# Patient Record
Sex: Male | Born: 1965
Health system: Southern US, Community
[De-identification: ages and names within clinical notes are randomized; demographics above are authoritative.]

## PROBLEM LIST (undated history)

## (undated) DIAGNOSIS — I2510 Atherosclerotic heart disease of native coronary artery without angina pectoris: Secondary | ICD-10-CM

## (undated) DIAGNOSIS — Q208 Other congenital malformations of cardiac chambers and connections: Secondary | ICD-10-CM

## (undated) DIAGNOSIS — I10 Essential (primary) hypertension: Secondary | ICD-10-CM

## (undated) DIAGNOSIS — Z720 Tobacco use: Secondary | ICD-10-CM

## (undated) DIAGNOSIS — Q249 Congenital malformation of heart, unspecified: Secondary | ICD-10-CM

## (undated) DIAGNOSIS — E785 Hyperlipidemia, unspecified: Secondary | ICD-10-CM

## (undated) DIAGNOSIS — T8859XA Other complications of anesthesia, initial encounter: Secondary | ICD-10-CM

## (undated) DIAGNOSIS — I4901 Ventricular fibrillation: Secondary | ICD-10-CM

## (undated) DIAGNOSIS — T4145XA Adverse effect of unspecified anesthetic, initial encounter: Secondary | ICD-10-CM

## (undated) HISTORY — PX: BACK SURGERY: SHX140

## (undated) HISTORY — DX: Ventricular fibrillation: I49.01

## (undated) HISTORY — DX: Tobacco use: Z72.0

## (undated) HISTORY — DX: Other congenital malformations of cardiac chambers and connections: Q20.8

## (undated) HISTORY — DX: Congenital malformation of heart, unspecified: Q24.9

## (undated) HISTORY — PX: INCISION AND DRAINAGE: SHX5863

## (undated) HISTORY — DX: Atherosclerotic heart disease of native coronary artery without angina pectoris: I25.10

---

## 2007-02-07 ENCOUNTER — Ambulatory Visit (HOSPITAL_COMMUNITY): Admission: RE | Admit: 2007-02-07 | Discharge: 2007-02-08 | Payer: Self-pay | Admitting: Specialist

## 2010-08-09 NOTE — Op Note (Signed)
NAME:  Stephen Hays, Stephen Hays NO.:  [id number]   MEDICAL RECORD NO.:  [id number]          PATIENT TYPE:  AMB   LOCATION:  DAY                          FACILITY:  [facility]   PHYSICIAN:  Jene Every, M.D.    DATE OF BIRTH:  08/10/1965   DATE OF PROCEDURE:  02/07/2007  DATE OF DISCHARGE:                               OPERATIVE REPORT   PREOPERATIVE DIAGNOSIS:  Spinal stenosis, herniated nucleus pulposus L5-  S1, left.   POSTOPERATIVE DIAGNOSIS:  Spinal stenosis, herniated nucleus pulposus L5-  S1, left.   PROCEDURE:  Lateral recess decompression, foraminotomy of S1,  microdiscectomy L5-S1.   ANESTHESIA:  General.   SURGEON:  Jene Every, M.D.   ASSISTANT:  Roma Schanz, P.A.-C.   BRIEF HISTORY AND INDICATIONS:  45 year old with left lower extremity  radicular pain in the S1 nerve root distribution secondary to focal HNP  compressing the S1 nerve root AND lateral recess, also with underlying  disc degeneration, has failed conservative treatment, indicated  decompression of the S1 nerve root.  The risks and benefits were  discussed including bleeding, infection, damage to neurovascular  structures, CSF leakage, epidural fibrosis, need for fusion in the  future, anesthetic complications, etc.   SURGICAL TECHNIQUE:  The patient was placed in the supine position.  After induction with adequate general anesthesia and 2 grams of Kefzol,  he was placed prone on the [place] frame.  All bony prominences were  well padded.  The lumbar region was prepped and draped in the usual  sterile fashion.  Two 18 gauge spinal needles were utilized to localize  the L5-S1 interspace confirmed with an x-ray.  An incision was made from  the spinous process of L5 to S1.  The subcutaneous tissue was dissected.  Electrocautery was utilized to achieve hemostasis.  The dorsal lumbar  fascia was identified and divided in line with the skin incision.  The  paraspinous muscle was elevated  from the lamina of L5-S1.  The operating  microscope was draped and brought onto the surgical field.  The  ligamentum flavum was detached from the cephalad edge of S1 utilizing a  straight curette.  A neural patty was placed beneath the ligamentum  flavum.  A foraminotomy of S1 was performed.  The ligamentum flavum was  removed from the interspace.  The S1 nerve was identified and gently  mobilized medially, there was a focal HNP noted compressing the ends of  the lateral recess.  We decompressed the lateral recess to the medial  border of the pedicle utilizing a 2 mm Kerrison.  The epidural venous  plexus was noted and cauterized.  With the neural elements well  protected, I performed an annulotomy.  Copious portions of disc material  was removed with the disc space and straight and upbiting pituitary  mobilized the free fragment.  Using straight and upbiting pituitary,  this was further mobilized with an Epstein, hockey stick, and a neural  probe.  Following full discectomy of herniated material, there was good  excursion of the S1 nerve root medial to the pedicle without  compression.  I examined  the foramen of S1 and L5.  Beneath the thecal  sac, the axilla of the root as well as the shoulder, there was no  retained fragment.   The disc space was copiously irrigated with antibiotic irrigation.  Inspection revealed no CSF leakage or active bleeding.  The retractor  was then removed.  The paraspinous muscle was inspected and there was no  active bleeding.  This was irrigated, as well.  The fascia was closed  with 0 Vicryl interrupted figure-of-eight suture, the subcutaneous  tissue with 2-0 Vicryl simple sutures, the wound was irrigated again,  and the skin reapproximated with 4-0  subcuticular PDS.  A sterile dressing was applied.  He was placed supine  on the hospital bed, extubated without difficulty, and transported to  the recovery room in satisfactory condition.  The patient  tolerated the  procedure well with no complications.  Minimal blood loss.      Jene Every, M.D.  Electronically Signed     JB/MEDQ  D:  02/07/2007  T:  02/07/2007  Job:  [id number]

## 2011-01-03 LAB — URINALYSIS, ROUTINE W REFLEX MICROSCOPIC
Bilirubin Urine: NEGATIVE
Hgb urine dipstick: NEGATIVE
Ketones, ur: NEGATIVE
Nitrite: NEGATIVE
Specific Gravity, Urine: 1.002 — ABNORMAL LOW
Urobilinogen, UA: 0.2

## 2011-01-03 LAB — COMPREHENSIVE METABOLIC PANEL
CO2: 30
Chloride: 100
GFR calc non Af Amer: >60
Glucose, Bld: 72
Total Bilirubin: 1.1
Total Protein: 7.2

## 2011-01-03 LAB — CBC
HCT: 47.8
MCV: 96
RBC: 4.98
RDW: 12.6
WBC: 6

## 2011-01-03 LAB — DIFFERENTIAL
Basophils Relative: 1
Eosinophils Absolute: 0.2
Eosinophils Relative: 4
Lymphocytes Relative: 25
Monocytes Relative: 12

## 2012-01-30 ENCOUNTER — Encounter (HOSPITAL_COMMUNITY): Payer: Self-pay | Admitting: *Deleted

## 2012-01-30 ENCOUNTER — Emergency Department (HOSPITAL_COMMUNITY): Payer: Self-pay

## 2012-01-30 ENCOUNTER — Emergency Department (HOSPITAL_COMMUNITY)
Admission: EM | Admit: 2012-01-30 | Discharge: 2012-01-31 | Disposition: A | Discharge status: Home / Self Care | Payer: Self-pay | Attending: Emergency Medicine | Admitting: Emergency Medicine

## 2012-01-30 DIAGNOSIS — Z79899 Other long term (current) drug therapy: Secondary | ICD-10-CM | POA: Insufficient documentation

## 2012-01-30 DIAGNOSIS — R05 Cough: Secondary | ICD-10-CM | POA: Insufficient documentation

## 2012-01-30 DIAGNOSIS — F17200 Nicotine dependence, unspecified, uncomplicated: Secondary | ICD-10-CM | POA: Insufficient documentation

## 2012-01-30 DIAGNOSIS — R110 Nausea: Secondary | ICD-10-CM | POA: Insufficient documentation

## 2012-01-30 DIAGNOSIS — R059 Cough, unspecified: Secondary | ICD-10-CM | POA: Insufficient documentation

## 2012-01-30 DIAGNOSIS — I10 Essential (primary) hypertension: Secondary | ICD-10-CM | POA: Insufficient documentation

## 2012-01-30 DIAGNOSIS — R079 Chest pain, unspecified: Secondary | ICD-10-CM | POA: Insufficient documentation

## 2012-01-30 DIAGNOSIS — R000 Tachycardia, unspecified: Secondary | ICD-10-CM | POA: Insufficient documentation

## 2012-01-30 DIAGNOSIS — R209 Unspecified disturbances of skin sensation: Secondary | ICD-10-CM | POA: Insufficient documentation

## 2012-01-30 HISTORY — DX: Essential (primary) hypertension: I10

## 2012-01-30 LAB — COMPREHENSIVE METABOLIC PANEL
ALT: 50 U/L (ref 0–53)
Alkaline Phosphatase: 59 U/L (ref 39–117)
BUN: 8 mg/dL (ref 6–23)
CO2: 22 mEq/L (ref 19–32)
Calcium: 9.7 mg/dL (ref 8.4–10.5)
Chloride: 97 mEq/L (ref 96–112)
Creatinine, Ser: 0.66 mg/dL (ref 0.50–1.35)
Potassium: 3.4 mEq/L — ABNORMAL LOW (ref 3.5–5.1)
Sodium: 136 mEq/L (ref 135–145)
Total Bilirubin: 0.4 mg/dL (ref 0.3–1.2)

## 2012-01-30 LAB — CBC WITH DIFFERENTIAL/PLATELET
Basophils Absolute: 0 10*3/uL (ref 0.0–0.1)
Basophils Relative: 1 % (ref 0–1)
HCT: 44.6 % (ref 39.0–52.0)
Lymphs Abs: 2.4 10*3/uL (ref 0.7–4.0)
MCH: 34 pg (ref 26.0–34.0)
MCHC: 35.9 g/dL (ref 30.0–36.0)
MCV: 94.9 fL (ref 78.0–100.0)
Monocytes Absolute: 0.7 10*3/uL (ref 0.1–1.0)
Neutrophils Relative %: 55 % (ref 43–77)
RBC: 4.7 MIL/uL (ref 4.22–5.81)
RDW: 12.3 % (ref 11.5–15.5)
WBC: 7.4 10*3/uL (ref 4.0–10.5)

## 2012-01-30 MED ORDER — ASPIRIN 81 MG PO CHEW
324.0000 mg | CHEWABLE_TABLET | Freq: Once | ORAL | Status: AC
  Administered 2012-01-30: 324 mg via ORAL
  Filled 2012-01-30: qty 4

## 2012-01-30 NOTE — ED Notes (Signed)
States started having chest pain/pressure with left shoulder pain and left arm numbness around 8 pm tonight.  States he noticed left arm pain first, then his chest pain and pressure.  States he did have one episode of nausea prior to coming to the hospital.  States he drinks alcohol on a nightly basis and smokes cigarettes.

## 2012-01-30 NOTE — ED Provider Notes (Signed)
History   This chart was scribed for Flint Melter, MD by Charolett Bumpers . The patient was seen in room APA02/APA02. Patient's care was started at 2031.   CSN: [id number]  Arrival date & time 01/30/12  2012   First MD Initiated Contact with Patient 01/30/12 2031      Chief Complaint  Patient presents with  . Chest Pain    The history is provided by the patient. No language interpreter was used.  Stephen Hays is a 46 y.o. male who presents to the Emergency Department complaining of moderate chest pain that started about 45 minutes ago while watching t.v. He states that the pain started in his left shoulder. He describes the chest pain as heaviness. He rates the chest and shoulder pain a 5/10 at it's worse, currently 3/10. He also reports associated nausea and left arm numbness. He has a chronic productive cough that is normal for him. He states his BP has been high. He states he took his BP meds twice today which is normal. BP here in ED is 164/106. He denies any h/o similar symptoms. He denies any emesis or diaphoresis. He denies any h/o shoulder injuries. He has a h/o HTN. He denies any h/o DM or hyperlipidemia. He denies any cardiac hx, but reports a maternal cardiac hx in her late 71's.   PCP: Dr. Regino Schultze  Past Medical History  Diagnosis Date  . Hypertension     Past Surgical History  Procedure Date  . Back surgery     No family history on file.  History  Substance Use Topics  . Smoking status: Current Every Day Smoker  . Smokeless tobacco: Not on file  . Alcohol Use: Yes      Review of Systems  Constitutional: Negative for fever, chills and diaphoresis.  Respiratory: Negative for shortness of breath.   Cardiovascular: Positive for chest pain.  Gastrointestinal: Positive for nausea. Negative for vomiting.  Musculoskeletal: Positive for arthralgias.       Left shoulder pain.   Neurological: Positive for numbness. Negative for weakness.  All other  systems reviewed and are negative.    Allergies  Review of patient's allergies indicates no known allergies.  Home Medications   Current Outpatient Rx  Name  Route  Sig  Dispense  Refill  . AMLODIPINE-OLMESARTAN 10-20 MG PO TABS   Oral   Take 1-2 tablets by mouth daily.           BP 105/66  Pulse 75  Temp 97.8 F (36.6 C)  Resp 21  SpO2 96%  Physical Exam  Nursing note and vitals reviewed. Constitutional: He is oriented to person, place, and time. He appears well-developed and well-nourished. No distress.  HENT:  Head: Normocephalic and atraumatic.  Right Ear: External ear normal.  Left Ear: External ear normal.  Nose: Nose normal.  Mouth/Throat: Oropharynx is clear and moist. No oropharyngeal exudate.  Eyes: Conjunctivae normal and EOM are normal. Pupils are equal, round, and reactive to light.  Neck: Normal range of motion. Neck supple. No tracheal deviation present.  Cardiovascular: Regular rhythm and normal heart sounds.  Tachycardia present.   No murmur heard. Pulmonary/Chest: Effort normal and breath sounds normal. No respiratory distress. He has no wheezes. He has no rales. He exhibits no tenderness.  Abdominal: Soft. Bowel sounds are normal. He exhibits no distension. There is no tenderness.  Musculoskeletal: Normal range of motion. He exhibits tenderness. He exhibits no edema.  Left shoulder tenderness that is worse with ROM.   Neurological: He is alert and oriented to person, place, and time. No cranial nerve deficit or sensory deficit.  Skin: Skin is warm and dry.  Psychiatric: He has a normal mood and affect. His behavior is normal.    ED Course  Procedures (including critical care time)  DIAGNOSTIC STUDIES: Oxygen Saturation is 100% on room air, normal by my interpretation.    COORDINATION OF CARE:  20:30-Medication Orders: Aspirin chewable tablet 324 mg-once.   20:45-Discussed planned course of treatment with the patient including aspirin,  a chest x-ray and blood work, who is agreeable at this time.    Reevaluation: 00:15- he, states that he feels better. He denies chest pain, now. He reports being under stress at work.    Date: 01/12/2012  Rate: 102  Rhythm: normal sinus rhythm  QRS Axis: normal  PR and QT Intervals: normal  ST/T Wave abnormalities: normal  PR and QRS Conduction Disutrbances:right bundle branch block  Narrative Interpretation:   Old EKG Reviewed: none available  Results for orders placed during the hospital encounter of 01/30/12  CBC WITH DIFFERENTIAL      Component Value Range   WBC 7.4  4.0 - 10.5 K/uL   RBC 4.70  4.22 - 5.81 MIL/uL   Hemoglobin 16.0  13.0 - 17.0 g/dL   HCT 16.1  09.6 - 04.5 %   MCV 94.9  78.0 - 100.0 fL   MCH 34.0  26.0 - 34.0 pg   MCHC 35.9  30.0 - 36.0 g/dL   RDW 40.9  81.1 - 91.4 %   Platelets 197  150 - 400 K/uL   Neutrophils Relative 55  43 - 77 %   Neutro Abs 4.1  1.7 - 7.7 K/uL   Lymphocytes Relative 32  12 - 46 %   Lymphs Abs 2.4  0.7 - 4.0 K/uL   Monocytes Relative 10  3 - 12 %   Monocytes Absolute 0.7  0.1 - 1.0 K/uL   Eosinophils Relative 2  0 - 5 %   Eosinophils Absolute 0.2  0.0 - 0.7 K/uL   Basophils Relative 1  0 - 1 %   Basophils Absolute 0.0  0.0 - 0.1 K/uL  COMPREHENSIVE METABOLIC PANEL      Component Value Range   Sodium 136  135 - 145 mEq/L   Potassium 3.4 (*) 3.5 - 5.1 mEq/L   Chloride 97  96 - 112 mEq/L   CO2 22  19 - 32 mEq/L   Glucose, Bld 136 (*) 70 - 99 mg/dL   BUN 8  6 - 23 mg/dL   Creatinine, Ser 7.82  0.50 - 1.35 mg/dL   Calcium 9.7  8.4 - 95.6 mg/dL   Total Protein 7.4  6.0 - 8.3 g/dL   Albumin 4.3  3.5 - 5.2 g/dL   AST 46 (*) 0 - 37 U/L   ALT 50  0 - 53 U/L   Alkaline Phosphatase 59  39 - 117 U/L   Total Bilirubin 0.4  0.3 - 1.2 mg/dL   GFR calc non Af Amer >90  >90 mL/min   GFR calc Af Amer >90  >90 mL/min  POCT I-STAT TROPONIN I      Component Value Range   Troponin i, poc 0.00  0.00 - 0.08 ng/mL   Comment 3           POCT  I-STAT TROPONIN I      Component Value  Range   Troponin i, poc 0.01  0.00 - 0.08 ng/mL   Comment 3             Dg Chest 1 View  01/30/2012  *RADIOLOGY REPORT*  Clinical Data: Chest pain today, smoker  CHEST - 1 VIEW  Comparison: 02/05/2007  Findings: Grossly unchanged cardiac silhouette and mediastinal contours.  No focal parenchymal opacities.  No pleural effusion or pneumothorax.  Grossly unchanged bones.  IMPRESSION: No acute cardiopulmonary disease.   Original Report Authenticated By: Tacey Ruiz, MD    Nursing notes, applicable records and vitals reviewed.  Radiologic Images/Reports reviewed.   1. Chest pain, unspecified       MDM  Low risk for cardiac chest pain ( [place] 1), with atypical for cardiac chest pain. Doubt ACS, PE, pneumonia. Doubt metabolic instability, serious bacterial infection or impending vascular collapse; the patient is stable for discharge.   I personally performed the services described in this documentation, which was scribed in my presence. The recorded information has been reviewed and considered     Plan: Home Medications- usual; Home Treatments- rest; Recommended follow up- PCP prn      Flint Melter, MD 01/31/12 0023

## 2012-01-30 NOTE — ED Notes (Signed)
Chest pain onset yesterday 

## 2014-06-09 ENCOUNTER — Encounter (HOSPITAL_COMMUNITY): Payer: Self-pay | Admitting: *Deleted

## 2014-06-09 ENCOUNTER — Emergency Department (HOSPITAL_COMMUNITY): Payer: Self-pay

## 2014-06-09 ENCOUNTER — Emergency Department (HOSPITAL_COMMUNITY)
Admission: EM | Admit: 2014-06-09 | Discharge: 2014-06-09 | Disposition: A | Discharge status: Home / Self Care | Payer: Self-pay | Attending: Emergency Medicine | Admitting: Emergency Medicine

## 2014-06-09 DIAGNOSIS — R0981 Nasal congestion: Secondary | ICD-10-CM | POA: Insufficient documentation

## 2014-06-09 DIAGNOSIS — Z720 Tobacco use: Secondary | ICD-10-CM | POA: Insufficient documentation

## 2014-06-09 DIAGNOSIS — R000 Tachycardia, unspecified: Secondary | ICD-10-CM | POA: Insufficient documentation

## 2014-06-09 DIAGNOSIS — R509 Fever, unspecified: Secondary | ICD-10-CM | POA: Insufficient documentation

## 2014-06-09 DIAGNOSIS — I10 Essential (primary) hypertension: Secondary | ICD-10-CM | POA: Insufficient documentation

## 2014-06-09 DIAGNOSIS — Z79899 Other long term (current) drug therapy: Secondary | ICD-10-CM | POA: Insufficient documentation

## 2014-06-09 LAB — CBC WITH DIFFERENTIAL/PLATELET
BASOS ABS: 0 10*3/uL (ref 0.0–0.1)
Basophils Relative: 1 % (ref 0–1)
Eosinophils Absolute: 0 10*3/uL (ref 0.0–0.7)
Eosinophils Relative: 0 % (ref 0–5)
HCT: 44.3 % (ref 39.0–52.0)
Hemoglobin: 15.6 g/dL (ref 13.0–17.0)
Lymphocytes Relative: 14 % (ref 12–46)
Lymphs Abs: 0.7 10*3/uL (ref 0.7–4.0)
MCH: 33.3 pg (ref 26.0–34.0)
MCHC: 35.2 g/dL (ref 30.0–36.0)
MCV: 94.5 fL (ref 78.0–100.0)
MONO ABS: 0.9 10*3/uL (ref 0.1–1.0)
MONOS PCT: 16 % — AB (ref 3–12)
Neutro Abs: 3.7 10*3/uL (ref 1.7–7.7)
Neutrophils Relative %: 69 % (ref 43–77)
PLATELETS: 148 10*3/uL — AB (ref 150–400)
RBC: 4.69 MIL/uL (ref 4.22–5.81)
RDW: 12.4 % (ref 11.5–15.5)
WBC: 5.3 10*3/uL (ref 4.0–10.5)

## 2014-06-09 LAB — COMPREHENSIVE METABOLIC PANEL
ALT: 65 U/L — AB (ref 0–53)
AST: 64 U/L — ABNORMAL HIGH (ref 0–37)
Albumin: 4.7 g/dL (ref 3.5–5.2)
Alkaline Phosphatase: 65 U/L (ref 39–117)
Anion gap: 9 (ref 5–15)
BUN: 8 mg/dL (ref 6–23)
CALCIUM: 9.4 mg/dL (ref 8.4–10.5)
CO2: 27 mmol/L (ref 19–32)
CREATININE: 0.82 mg/dL (ref 0.50–1.35)
Chloride: 96 mmol/L (ref 96–112)
GFR calc Af Amer: >90 mL/min (ref >90)
GFR calc non Af Amer: >90 mL/min (ref >90)
GLUCOSE: 121 mg/dL — AB (ref 70–99)
Potassium: 3.9 mmol/L (ref 3.5–5.1)
Sodium: 132 mmol/L — ABNORMAL LOW (ref 135–145)
TOTAL PROTEIN: 7.5 g/dL (ref 6.0–8.3)
Total Bilirubin: 0.7 mg/dL (ref 0.3–1.2)

## 2014-06-09 MED ORDER — LABETALOL HCL 5 MG/ML IV SOLN
20.0000 mg | Freq: Once | INTRAVENOUS | Status: AC
  Administered 2014-06-09: 20 mg via INTRAVENOUS
  Filled 2014-06-09: qty 4

## 2014-06-09 MED ORDER — ACETAMINOPHEN 500 MG PO TABS
1000.0000 mg | ORAL_TABLET | Freq: Once | ORAL | Status: AC
  Administered 2014-06-09: 1000 mg via ORAL
  Filled 2014-06-09: qty 2

## 2014-06-09 NOTE — Discharge Instructions (Signed)
Take tylenol for fever and follow up with your md soon if your bp stays elevated

## 2014-06-09 NOTE — ED Notes (Signed)
Dr Zammit at bedside. 

## 2014-06-09 NOTE — ED Notes (Signed)
Pt reporting starting lisinopril/HCTZ yesterday, and today started on amlodipine.  Has had a total of 2 doses of each medication in past 24 hours.  Pt reporting headache and tingling in tips of fingers.

## 2014-06-09 NOTE — ED Provider Notes (Signed)
CSN: [id number]     Arrival date & time 06/09/14  1921 History  This chart was scribed for Milton Ferguson, MD by Chester Holstein, ED Scribe. This patient was seen in room APA03/APA03 and the patient's care was started at 7:59 PM.    Chief Complaint  Patient presents with  . Hypertension     Patient is a 49 y.o. male presenting with hypertension. The history is provided by the patient. No language interpreter was used.  Hypertension This is a chronic problem. The current episode started 12 to 24 hours ago. The problem occurs constantly. The problem has not changed since onset.Associated symptoms include headaches. Pertinent negatives include no chest pain and no abdominal pain. Nothing aggravates the symptoms. Nothing relieves the symptoms. The treatment provided mild relief.   HPI Comments: CHI GARLOW is a 49 y.o. male who presents to the Emergency Department complaining of elevated BP with onset this morning.  Pt with h/o HTN but notes he had not been on medication for it in 1 year. Pt was seen by PCP yesterday and started on lisinopril-HCTZ yesterday and amlodipine today. Pt notes associated headache and nasal congestion. Pt febrile in exam room. Pt with FHx of CVA <55 yo. Pt denies sore throat and cough.   Past Medical History  Diagnosis Date  . Hypertension    Past Surgical History  Procedure Laterality Date  . Back surgery     History reviewed. No pertinent family history. History  Substance Use Topics  . Smoking status: Current Every Day Smoker  . Smokeless tobacco: Not on file  . Alcohol Use: Yes    Review of Systems  Constitutional: Positive for fever. Negative for appetite change and fatigue.  HENT: Positive for congestion. Negative for ear discharge, sinus pressure and sore throat.   Eyes: Negative for discharge.  Respiratory: Negative for cough.   Cardiovascular: Negative for chest pain.  Gastrointestinal: Negative for abdominal pain and diarrhea.  Genitourinary:  Negative for frequency and hematuria.  Musculoskeletal: Negative for back pain.  Skin: Negative for rash.  Neurological: Positive for headaches. Negative for seizures.  Psychiatric/Behavioral: Negative for hallucinations.      Allergies  Review of patient's allergies indicates no known allergies.  Home Medications   Prior to Admission medications   Medication Sig Start Date End Date Taking? Authorizing Provider  lisinopril-hydrochlorothiazide (PRINZIDE,ZESTORETIC) 20-12.5 MG per tablet Take 1 tablet by mouth daily.   Yes Historical Provider, MD  amlodipine-olmesartan (AZOR) 10-20 MG per tablet Take 1-2 tablets by mouth daily.    Historical Provider, MD   BP 199/123 mmHg  Pulse 112  Temp(Src) 100.3 F (37.9 C) (Oral)  Resp 18  Ht 5\' 11"  (1.803 m)  Wt 215 lb (97.523 kg)  BMI 30.00 kg/m2  SpO2 100% Physical Exam  Constitutional: He is oriented to person, place, and time. He appears well-developed.  HENT:  Head: Normocephalic.  Eyes: Conjunctivae and EOM are normal. No scleral icterus.  Neck: Neck supple. No thyromegaly present.  Cardiovascular: Regular rhythm.  Tachycardia present.  Exam reveals no gallop and no friction rub.   No murmur heard. Pulmonary/Chest: No stridor. He has no wheezes. He has no rales. He exhibits no tenderness.  Abdominal: He exhibits no distension. There is no tenderness. There is no rebound.  Musculoskeletal: Normal range of motion. He exhibits no edema.  Lymphadenopathy:    He has no cervical adenopathy.  Neurological: He is oriented to person, place, and time. He exhibits normal muscle tone. Coordination normal.  Skin: No rash noted. No erythema.  Psychiatric: He has a normal mood and affect. His behavior is normal.  Nursing note and vitals reviewed.   ED Course  Procedures (including critical care time) DIAGNOSTIC STUDIES: Oxygen Saturation is 100% on room air, normal by my interpretation.    COORDINATION OF CARE: 8:01 PM Discussed  treatment plan with patient at beside, the patient agrees with the plan and has no further questions at this time.   Labs Review Labs Reviewed - No data to display  Imaging Review No results found.   EKG Interpretation None      MDM   Final diagnoses:  None   Htn,  Fever.   Pt to follow up with pcp  The chart was scribed for me under my direct supervision.  I personally performed the history, physical, and medical decision making and all procedures in the evaluation of this patient.Milton Ferguson, MD 06/09/14 [phone]

## 2014-06-09 NOTE — ED Notes (Signed)
Pt. Reports being off blood pressure medication for  1 year. Pt. Reports seeing PCP and being started back on BP medication yesterday. Pt. Reports taking 1 Lispinopril 20mg /hydrochlorothiazide12.5mg  this morning. Pt. Reports headache starting this morning. Pt. Reports BP elevated throughout day. Pt. Reports PCP called in another medication this afternoon. Pt. Reports taking 1 Amlodipine 5mg  at 1545, taking another at 1700. Pt. Denies dizziness. Pt. Reports brother had stroke at age 14.

## 2014-06-09 NOTE — ED Notes (Signed)
Pt. Refusing CT. Pt. States that he has outpatient CT scheduled for this week.

## 2014-12-26 ENCOUNTER — Emergency Department (HOSPITAL_COMMUNITY)
Admission: EM | Admit: 2014-12-26 | Discharge: 2014-12-27 | Disposition: A | Discharge status: Home / Self Care | Payer: BLUE CROSS/BLUE SHIELD | Attending: Emergency Medicine | Admitting: Emergency Medicine

## 2014-12-26 ENCOUNTER — Encounter (HOSPITAL_COMMUNITY): Payer: Self-pay | Admitting: *Deleted

## 2014-12-26 ENCOUNTER — Emergency Department (HOSPITAL_COMMUNITY): Payer: BLUE CROSS/BLUE SHIELD

## 2014-12-26 DIAGNOSIS — K118 Other diseases of salivary glands: Secondary | ICD-10-CM | POA: Insufficient documentation

## 2014-12-26 DIAGNOSIS — I10 Essential (primary) hypertension: Secondary | ICD-10-CM | POA: Insufficient documentation

## 2014-12-26 DIAGNOSIS — Z720 Tobacco use: Secondary | ICD-10-CM | POA: Diagnosis not present

## 2014-12-26 DIAGNOSIS — E871 Hypo-osmolality and hyponatremia: Secondary | ICD-10-CM | POA: Diagnosis not present

## 2014-12-26 DIAGNOSIS — R51 Headache: Secondary | ICD-10-CM | POA: Diagnosis present

## 2014-12-26 DIAGNOSIS — I2510 Atherosclerotic heart disease of native coronary artery without angina pectoris: Secondary | ICD-10-CM

## 2014-12-26 HISTORY — DX: Atherosclerotic heart disease of native coronary artery without angina pectoris: I25.10

## 2014-12-26 LAB — BASIC METABOLIC PANEL
Anion gap: 12 (ref 5–15)
BUN: 10 mg/dL (ref 6–20)
CO2: 23 mmol/L (ref 22–32)
Calcium: 9 mg/dL (ref 8.9–10.3)
Chloride: 91 mmol/L — ABNORMAL LOW (ref 101–111)
Creatinine, Ser: 0.63 mg/dL (ref 0.61–1.24)
GFR calc non Af Amer: >60 mL/min (ref >60)
GLUCOSE: 130 mg/dL — AB (ref 65–99)
Potassium: 3.3 mmol/L — ABNORMAL LOW (ref 3.5–5.1)
Sodium: 126 mmol/L — ABNORMAL LOW (ref 135–145)

## 2014-12-26 LAB — CBC WITH DIFFERENTIAL/PLATELET
BASOS PCT: 0 %
Basophils Absolute: 0 10*3/uL (ref 0.0–0.1)
EOS PCT: 3 %
Eosinophils Absolute: 0.2 10*3/uL (ref 0.0–0.7)
HEMATOCRIT: 42.7 % (ref 39.0–52.0)
Hemoglobin: 15.9 g/dL (ref 13.0–17.0)
Lymphocytes Relative: 30 %
Lymphs Abs: 2.7 10*3/uL (ref 0.7–4.0)
MCH: 34.3 pg — ABNORMAL HIGH (ref 26.0–34.0)
MCHC: 37.2 g/dL — ABNORMAL HIGH (ref 30.0–36.0)
MCV: 92.2 fL (ref 78.0–100.0)
MONO ABS: 0.8 10*3/uL (ref 0.1–1.0)
Monocytes Relative: 9 %
NEUTROS ABS: 5.4 10*3/uL (ref 1.7–7.7)
Neutrophils Relative %: 58 %
PLATELETS: 182 10*3/uL (ref 150–400)
RBC: 4.63 MIL/uL (ref 4.22–5.81)
RDW: 11.9 % (ref 11.5–15.5)
WBC: 9.2 10*3/uL (ref 4.0–10.5)

## 2014-12-26 MED ORDER — HYDROMORPHONE HCL 1 MG/ML IJ SOLN
0.5000 mg | Freq: Once | INTRAMUSCULAR | Status: AC
  Administered 2014-12-26: 0.5 mg via INTRAVENOUS
  Filled 2014-12-26: qty 1

## 2014-12-26 MED ORDER — HYDROMORPHONE HCL 1 MG/ML IJ SOLN
1.0000 mg | Freq: Once | INTRAMUSCULAR | Status: AC
  Administered 2014-12-26: 1 mg via INTRAVENOUS
  Filled 2014-12-26: qty 1

## 2014-12-26 MED ORDER — SODIUM CHLORIDE 0.9 % IV BOLUS (SEPSIS)
1000.0000 mL | Freq: Once | INTRAVENOUS | Status: AC
  Administered 2014-12-26: 1000 mL via INTRAVENOUS

## 2014-12-26 MED ORDER — POTASSIUM CHLORIDE CRYS ER 20 MEQ PO TBCR
40.0000 meq | EXTENDED_RELEASE_TABLET | Freq: Once | ORAL | Status: AC
  Administered 2014-12-26: 40 meq via ORAL
  Filled 2014-12-26: qty 2

## 2014-12-26 MED ORDER — IOHEXOL 300 MG/ML  SOLN
100.0000 mL | Freq: Once | INTRAMUSCULAR | Status: AC | PRN
  Administered 2014-12-26: 100 mL via INTRAVENOUS

## 2014-12-26 MED ORDER — ONDANSETRON HCL 4 MG/2ML IJ SOLN
4.0000 mg | Freq: Once | INTRAMUSCULAR | Status: AC
  Administered 2014-12-26: 4 mg via INTRAVENOUS
  Filled 2014-12-26: qty 2

## 2014-12-26 NOTE — ED Notes (Addendum)
Pt states has had a knot just under the right side of his jaw for a while. Seems to him to be larger today & a lot pain. Pt denies any problems swallowing or breathing.

## 2014-12-26 NOTE — ED Provider Notes (Signed)
CSN: [id number]     Arrival date & time 12/26/14  2049 History   First MD Initiated Contact with Patient 12/26/14 2103     Chief Complaint  Patient presents with  . Facial Pain     (Consider location/radiation/quality/duration/timing/severity/associated sxs/prior Treatment) HPI   Stephen Hays is a 49 y.o. male who presents to the Emergency Department complaining of pain and local swelling to the right face.  He reports having a large "knot" there for several years and has been asymptomatic, but developed sharp pain earlier today.  Pain radiates to the back of his head and into his neck.  He states the area appears to be larger today than usual.  He has not had any evaluation previously.  He denies pain with rotation of his head, difficulty swallowing, shortness of breath, fever, chills, or malaise.  Also denies dry mouth   Past Medical History  Diagnosis Date  . Hypertension    Past Surgical History  Procedure Laterality Date  . Back surgery     History reviewed. No pertinent family history. Social History  Substance Use Topics  . Smoking status: Current Every Day Smoker  . Smokeless tobacco: None  . Alcohol Use: Yes    Review of Systems  Constitutional: Negative for fever, chills, activity change, appetite change and fatigue.  HENT: Negative for congestion, dental problem, ear pain, facial swelling (right facial swelling), sore throat, trouble swallowing and voice change.   Eyes: Negative for pain and visual disturbance.  Respiratory: Negative for cough and shortness of breath.   Gastrointestinal: Negative for nausea, vomiting and abdominal pain.  Musculoskeletal: Negative for arthralgias, neck pain and neck stiffness.  Skin: Negative for color change and rash.  Neurological: Negative for dizziness, facial asymmetry, speech difficulty, numbness and headaches.  Hematological: Negative for adenopathy.  All other systems reviewed and are negative.     Allergies   Review of patient's allergies indicates no known allergies.  Home Medications   Prior to Admission medications   Medication Sig Start Date End Date Taking? Authorizing Provider  ibuprofen (ADVIL,MOTRIN) 800 MG tablet Take 800 mg by mouth every 8 (eight) hours as needed for mild pain.   Yes Historical Provider, MD   BP 165/97 mmHg  Pulse 82  Temp(Src) 97.5 F (36.4 C) (Oral)  Resp 19  SpO2 100% Physical Exam  Constitutional: He is oriented to person, place, and time. He appears well-developed and well-nourished. No distress.  HENT:  Head: Normocephalic and atraumatic.  Mouth/Throat: Uvula is midline, oropharynx is clear and moist and mucous membranes are normal. No trismus in the jaw. No uvula swelling.  6 cm firm, non-mobile mass just lateral to the arch of the right mandible.  No erythema or fluctuance.  No dental tenderness or oral lesions.    Neck: Normal range of motion. Neck supple. No tracheal deviation present. No thyromegaly present.  Cardiovascular: Normal rate, regular rhythm and normal heart sounds.   No murmur heard. Pulmonary/Chest: Effort normal and breath sounds normal. No respiratory distress.  Musculoskeletal: Normal range of motion.  Lymphadenopathy:    He has no cervical adenopathy.  Neurological: He is alert and oriented to person, place, and time. He exhibits normal muscle tone. Coordination normal.  Skin: Skin is warm and dry.  Psychiatric: He has a normal mood and affect.  Nursing note and vitals reviewed.   ED Course  Procedures (including critical care time) Labs Review Labs Reviewed  BASIC METABOLIC PANEL - Abnormal; Notable for the following:  Sodium 126 (*)    Potassium 3.3 (*)    Chloride 91 (*)    Glucose, Bld 130 (*)    All other components within normal limits  CBC WITH DIFFERENTIAL/PLATELET - Abnormal; Notable for the following:    MCH 34.3 (*)    MCHC 37.2 (*)    All other components within normal limits    Imaging Review Ct Soft  Tissue Neck W Contrast  12/26/2014   CLINICAL DATA:  Right facial mass, long-standing but now causing pain.  EXAM: CT NECK WITH CONTRAST  TECHNIQUE: Multidetector CT imaging of the neck was performed using the standard protocol following the bolus administration of intravenous contrast.  CONTRAST:  127mL OMNIPAQUE IOHEXOL 300 MG/ML  SOLN  COMPARISON:  None.  FINDINGS: Pharynx and larynx: No asymmetric mass effect or enhancement.  Salivary glands: Right neck mass is a ill-defined nearly 4 cm rounded mass in the right parotid tail with heterogeneous enhancement. Benign or malignant tumors can have this appearance. No regional adenopathy. No skullbase foraminal erosion or expansion. No second salivary gland mass seen.  Thyroid: Negative  Lymph nodes: No enlarged or necrotic appearing nodes  Vascular: Carotid bifurcation atherosclerosis without flow limiting stenosis.  Limited intracranial: Negative  Visualized orbits: Negative  Mastoids and visualized paranasal sinuses: Patchy inflammatory mucosal thickening in the paranasal sinuses, greatest in the ethmoids. No acute sinusitis.  Skeleton: Bulky spondylosis inferiorly in the cervical spine. No acute finding or aggressive process.  Upper chest:  No apical pulmonary nodules.  IMPRESSION: ~4 cm right parotid tail neoplasm.  Recommend ENT referral.   Electronically Signed   By: Monte Fantasia M.D.   On: 12/26/2014 22:45   I have personally reviewed and evaluated these images and lab results as part of my medical decision-making.   EKG Interpretation None      MDM   Final diagnoses:  Mass of parotid gland  Hyponatremia   Pt is well appearing, vitals stable.  Airway patent.  Hyponatremia appears to be a chronic issue, but slightly worse this evening.  I will give liter of saline and oral potassium,  Pt has appt with his PMD, Dr. Delphina Cahill, in one week.  I have advised him that his electrolytes will need to be rechecked at that time. CT results discussed with  pateint and importance of close f/u for likely biopsy.  I will start antibiotics, short course of pain medication and referral info given for ENT.  Pt agrees to care plan and close ENT f/u.  Appears stable for d/c   Kem Parkinson, PA-C 12/27/14 0109  Orpah Greek, MD 12/27/14 [phone]

## 2014-12-26 NOTE — ED Notes (Signed)
Pt states he has been having pain to the right side of his face just under his ear lobe. Pt states this "knot" has been there for years but has been really causing him pain today. Pt states he has never had any problem out of it.

## 2014-12-27 MED ORDER — HYDROCODONE-ACETAMINOPHEN 7.5-325 MG PO TABS: 1.0000 | ORAL_TABLET | Freq: Four times a day (QID) | ORAL | Status: DC | PRN

## 2014-12-27 MED ORDER — CLINDAMYCIN HCL 300 MG PO CAPS: 300.0000 mg | ORAL_CAPSULE | Freq: Four times a day (QID) | ORAL | Status: DC

## 2014-12-27 NOTE — Discharge Instructions (Signed)
Hyponatremia  Hyponatremia is when the salt (sodium) in your blood is low. When salt becomes low, your cells take in extra water and puff up (swell). The puffiness can happen in the whole body. It mostly affects the brain and is very serious.  HOME CARE  Only take medicine as told by your doctor.  Follow any diet instructions you were given. This includes limiting how much fluid you drink.  Keep all doctor visits for tests as told.  Avoid alcohol and drugs. GET HELP RIGHT AWAY IF:  You start to twitch and shake (seize).  You pass out (faint).  You continue to have watery poop (diarrhea) or you throw up (vomit).  You feel sick to your stomach (nauseous).  You are tired (fatigued), have a headache, are confused, or feel weak.  Your problems that first brought you to the doctor come back.  You have trouble following your diet instructions. MAKE SURE YOU:   Understand these instructions.  Will watch your condition.  Will get help right away if you are not doing well or get worse. Document Released: 11/23/2010 Document Revised: 06/05/2011 Document Reviewed: 11/23/2010 [facility] Patient Information 2015 [place], [place]. This information is not intended to replace advice given to you by your health care provider. Make sure you discuss any questions you have with your health care provider.

## 2014-12-27 NOTE — ED Notes (Signed)
Pt alert & oriented x4, stable gait. Patient given discharge instructions, paperwork & prescription(s). Patient  instructed to stop at the registration desk to finish any additional paperwork. Patient verbalized understanding. Pt left department w/ no further questions. 

## 2014-12-29 ENCOUNTER — Other Ambulatory Visit (HOSPITAL_COMMUNITY): Payer: Self-pay | Admitting: Otolaryngology

## 2015-01-07 ENCOUNTER — Encounter (HOSPITAL_COMMUNITY)
Admission: RE | Admit: 2015-01-07 | Discharge: 2015-01-07 | Disposition: A | Discharge status: Home / Self Care | Payer: BLUE CROSS/BLUE SHIELD | Source: Ambulatory Visit | Attending: Otolaryngology | Admitting: Otolaryngology

## 2015-01-07 ENCOUNTER — Encounter (HOSPITAL_COMMUNITY): Payer: Self-pay

## 2015-01-07 DIAGNOSIS — R221 Localized swelling, mass and lump, neck: Secondary | ICD-10-CM

## 2015-01-07 DIAGNOSIS — F17200 Nicotine dependence, unspecified, uncomplicated: Secondary | ICD-10-CM

## 2015-01-07 DIAGNOSIS — Z01818 Encounter for other preprocedural examination: Secondary | ICD-10-CM | POA: Insufficient documentation

## 2015-01-07 DIAGNOSIS — Z01812 Encounter for preprocedural laboratory examination: Secondary | ICD-10-CM

## 2015-01-07 DIAGNOSIS — I10 Essential (primary) hypertension: Secondary | ICD-10-CM

## 2015-01-07 LAB — CBC
HCT: 43 % (ref 39.0–52.0)
Hemoglobin: 15.6 g/dL (ref 13.0–17.0)
MCH: 33.2 pg (ref 26.0–34.0)
MCHC: 36.3 g/dL — ABNORMAL HIGH (ref 30.0–36.0)
MCV: 91.5 fL (ref 78.0–100.0)
Platelets: 225 10*3/uL (ref 150–400)
RBC: 4.7 MIL/uL (ref 4.22–5.81)
RDW: 11.6 % (ref 11.5–15.5)
WBC: 7.7 10*3/uL (ref 4.0–10.5)

## 2015-01-07 LAB — BASIC METABOLIC PANEL
ANION GAP: 10 (ref 5–15)
BUN: 10 mg/dL (ref 6–20)
CALCIUM: 10.3 mg/dL (ref 8.9–10.3)
CO2: 26 mmol/L (ref 22–32)
Chloride: 89 mmol/L — ABNORMAL LOW (ref 101–111)
Creatinine, Ser: 0.92 mg/dL (ref 0.61–1.24)
Glucose, Bld: 106 mg/dL — ABNORMAL HIGH (ref 65–99)
Potassium: 4.7 mmol/L (ref 3.5–5.1)
SODIUM: 125 mmol/L — AB (ref 135–145)

## 2015-01-07 NOTE — Progress Notes (Signed)
Anesthesia Chart Review:  Pt is 49 year old male scheduled for R parotidectomy on 01/08/2015 with Dr. Simeon Craft.   PCP is Dr. Delphina Cahill in [place].   PMH includes: HTN. Current smoker. BMI 29.5.  Medications include: azilsartan-chlorthalidone.   Preoperative labs reviewed.  Na 125, Cl 89.   EKG 06/09/2014: Sinus tachycardia (107 bpm). RBBB.   Reviewed case with Dr. Glennon Mac.   If no changes, I anticipate pt can proceed with surgery as scheduled.   Willeen Cass, FNP-BC [facility]/Anesthesiology Phone: [phone] 01/07/2015 3:13 PM

## 2015-01-07 NOTE — H&P (Signed)
01/08/15 01/08/2015  Stephen Hays  PREOPERATIVE HISTORY AND PHYSICAL  CHIEF COMPLAINT: right parotid mass  HISTORY: This is a 49 year old who presents with right infraauricular/parotid mass present for several months or more that acutely became larger and tender around 12/27/14. Had Ct neck at [facility] showing ~ 3 to 4cm right parotid mass.  He now presents for right parotidectomy with facial nerve dissection and preservation.  Dr. Simeon Craft, [place] has discussed the risks (facial nerve injury/facial paralysis or weakness, hematoma, bleeding, infection, anesthesia risks, mass recurrence, need for additional treatment, etc.), benefits, and alternatives of this procedure. The patient understands the risks and would like to proceed with the procedure. The chances of success of the procedure are >50% and the patient understands this. I personally performed an examination of the patient within 24 hours of the procedure.  PAST MEDICAL HISTORY: Past Medical History  Diagnosis Date  . Hypertension     PAST SURGICAL HISTORY: Past Surgical History  Procedure Laterality Date  . Back surgery      MEDICATIONS: No current facility-administered medications on file prior to encounter.   Current Outpatient Prescriptions on File Prior to Encounter  Medication Sig Dispense Refill  . HYDROcodone-acetaminophen (NORCO) 7.5-325 MG tablet Take 1 tablet by mouth every 6 (six) hours as needed for moderate pain. 15 tablet 0  . ibuprofen (ADVIL,MOTRIN) 800 MG tablet Take 800 mg by mouth every 8 (eight) hours as needed for mild pain.    . clindamycin (CLEOCIN) 300 MG capsule Take 1 capsule (300 mg total) by mouth 4 (four) times daily. For 7 days (Patient taking differently: Take 300 mg by mouth 4 (four) times daily. 7 day course started 12/27/14) 28 capsule 0    ALLERGIES: Allergies  Allergen Reactions  . Doxycycline Nausea Only     SOCIAL HISTORY: Social History   Social History  . Marital Status:  Single    Spouse Name: N/A  . Number of Children: N/A  . Years of Education: N/A   Occupational History  . Not on file.   Social History Main Topics  . Smoking status: Current Every Day Smoker  . Smokeless tobacco: Not on file  . Alcohol Use: Yes  . Drug Use: Not on file  . Sexual Activity: Not on file   Other Topics Concern  . Not on file   Social History Narrative    FAMILY HISTORY: No family history on file.  REVIEW OF SYSTEMS:  HEENT: right parotid mass/swelling, otherwise negative x 12 systems except per HPI   PHYSICAL EXAM:  GENERAL:  NAD VITAL SIGNS:    Filed Vitals:   01/08/15 0808  BP: 135/78  Pulse: 73  Temp: 98.2 F (36.8 C)  Resp: 20   SKIN:  Warm, dry HEENT:  Oral cavity clear NECK/lymph: no obvious adenopathy, there is a palpable ~ 3cm right tail of parotid/infraauricular mass present ABDOMEN:  soft MUSCULOSKELETAL: normal strength PSYCH:  Normal affect NEUROLOGIC:  Cn 2-12 intact and symmetric, facial nerve House-Brackmann 1/6 bilaterally in all divisions  DIAGNOSTIC STUDIES: Ct neck shows right 3 to 4cm parotid mass  ASSESSMENT AND PLAN: Plan to proceed with right parotidectomy with facial nerve dissection and preservation. Patient understands the risks, benefits, and alternatives. Informed written consent signed witnessed and on chart 01/08/15  9:23 AM  Stephen Hays

## 2015-01-07 NOTE — Pre-Procedure Instructions (Signed)
[name]  01/07/2015     Your procedure is scheduled on : Friday January 08, 2015 at 10:00 AM.  Report to [facility] Admitting at 8:00 A.M.  Call this number if you have problems the morning of surgery: [phone]    Remember:  Do not eat food or drink liquids after midnight.  Take these medicines the morning of surgery with A SIP OF WATER : Hydrocodone if needed for pain   Please stop taking any vitamins, herbal medications, Ibuprofen, Advil, Motrin, Aleve, etc   Do not wear jewelry.  Do not wear lotions, powders, or cologne.     Men may shave face and neck.  Do not bring valuables to the hospital.  [facility] is not responsible for any belongings or valuables.  Contacts, dentures or bridgework may not be worn into surgery.  Leave your suitcase in the car.  After surgery it may be brought to your room.  For patients admitted to the hospital, discharge time will be determined by your treatment team.  Patients discharged the day of surgery will not be allowed to drive home.   Name and phone number of your driver:    Special instructions:  Shower using CHG soap the night before and the morning of your surgery  Please read over the following fact sheets that you were given. Pain Booklet, Coughing and Deep Breathing and Surgical Site Infection Prevention

## 2015-01-08 ENCOUNTER — Encounter (HOSPITAL_COMMUNITY)
Admission: AD | Disposition: A | Discharge status: Home / Self Care | Payer: Self-pay | Source: Ambulatory Visit | Attending: Otolaryngology

## 2015-01-08 ENCOUNTER — Encounter (HOSPITAL_COMMUNITY): Payer: Self-pay | Admitting: *Deleted

## 2015-01-08 ENCOUNTER — Inpatient Hospital Stay (HOSPITAL_COMMUNITY)
Admission: AD | Admit: 2015-01-08 | Discharge: 2015-01-13 | DRG: 245 | Disposition: A | Discharge status: Home / Self Care | Payer: BLUE CROSS/BLUE SHIELD | Source: Ambulatory Visit | Attending: Otolaryngology | Admitting: Otolaryngology

## 2015-01-08 ENCOUNTER — Ambulatory Visit (HOSPITAL_COMMUNITY): Payer: BLUE CROSS/BLUE SHIELD | Admitting: Certified Registered Nurse Anesthetist

## 2015-01-08 ENCOUNTER — Inpatient Hospital Stay (HOSPITAL_COMMUNITY): Payer: BLUE CROSS/BLUE SHIELD

## 2015-01-08 ENCOUNTER — Ambulatory Visit (HOSPITAL_COMMUNITY): Payer: BLUE CROSS/BLUE SHIELD | Admitting: Emergency Medicine

## 2015-01-08 DIAGNOSIS — Z7982 Long term (current) use of aspirin: Secondary | ICD-10-CM | POA: Diagnosis not present

## 2015-01-08 DIAGNOSIS — Z716 Tobacco abuse counseling: Secondary | ICD-10-CM

## 2015-01-08 DIAGNOSIS — I959 Hypotension, unspecified: Secondary | ICD-10-CM | POA: Diagnosis not present

## 2015-01-08 DIAGNOSIS — F17200 Nicotine dependence, unspecified, uncomplicated: Secondary | ICD-10-CM | POA: Diagnosis present

## 2015-01-08 DIAGNOSIS — I469 Cardiac arrest, cause unspecified: Secondary | ICD-10-CM | POA: Insufficient documentation

## 2015-01-08 DIAGNOSIS — R000 Tachycardia, unspecified: Secondary | ICD-10-CM | POA: Diagnosis present

## 2015-01-08 DIAGNOSIS — Z978 Presence of other specified devices: Secondary | ICD-10-CM

## 2015-01-08 DIAGNOSIS — I9589 Other hypotension: Secondary | ICD-10-CM | POA: Diagnosis not present

## 2015-01-08 DIAGNOSIS — R221 Localized swelling, mass and lump, neck: Secondary | ICD-10-CM | POA: Diagnosis present

## 2015-01-08 DIAGNOSIS — E871 Hypo-osmolality and hyponatremia: Secondary | ICD-10-CM | POA: Diagnosis present

## 2015-01-08 DIAGNOSIS — Z959 Presence of cardiac and vascular implant and graft, unspecified: Secondary | ICD-10-CM

## 2015-01-08 DIAGNOSIS — I4510 Unspecified right bundle-branch block: Secondary | ICD-10-CM | POA: Diagnosis present

## 2015-01-08 DIAGNOSIS — Z881 Allergy status to other antibiotic agents status: Secondary | ICD-10-CM | POA: Diagnosis not present

## 2015-01-08 DIAGNOSIS — Z789 Other specified health status: Secondary | ICD-10-CM

## 2015-01-08 DIAGNOSIS — I4901 Ventricular fibrillation: Secondary | ICD-10-CM | POA: Diagnosis not present

## 2015-01-08 DIAGNOSIS — I119 Hypertensive heart disease without heart failure: Secondary | ICD-10-CM | POA: Diagnosis present

## 2015-01-08 DIAGNOSIS — Z7289 Other problems related to lifestyle: Secondary | ICD-10-CM

## 2015-01-08 DIAGNOSIS — E669 Obesity, unspecified: Secondary | ICD-10-CM | POA: Diagnosis present

## 2015-01-08 DIAGNOSIS — I519 Heart disease, unspecified: Secondary | ICD-10-CM

## 2015-01-08 DIAGNOSIS — I97711 Intraoperative cardiac arrest during other surgery: Principal | ICD-10-CM | POA: Diagnosis not present

## 2015-01-08 DIAGNOSIS — R220 Localized swelling, mass and lump, head: Secondary | ICD-10-CM | POA: Diagnosis present

## 2015-01-08 DIAGNOSIS — I2510 Atherosclerotic heart disease of native coronary artery without angina pectoris: Secondary | ICD-10-CM | POA: Diagnosis present

## 2015-01-08 DIAGNOSIS — L0231 Cutaneous abscess of buttock: Secondary | ICD-10-CM | POA: Diagnosis present

## 2015-01-08 DIAGNOSIS — Z6829 Body mass index (BMI) 29.0-29.9, adult: Secondary | ICD-10-CM | POA: Diagnosis not present

## 2015-01-08 DIAGNOSIS — I5189 Other ill-defined heart diseases: Secondary | ICD-10-CM

## 2015-01-08 DIAGNOSIS — I10 Essential (primary) hypertension: Secondary | ICD-10-CM | POA: Diagnosis present

## 2015-01-08 DIAGNOSIS — K118 Other diseases of salivary glands: Secondary | ICD-10-CM | POA: Diagnosis present

## 2015-01-08 HISTORY — PX: CARDIAC CATHETERIZATION: SHX172

## 2015-01-08 HISTORY — PX: PAROTIDECTOMY: SHX2163

## 2015-01-08 HISTORY — DX: Hyperlipidemia, unspecified: E78.5

## 2015-01-08 LAB — COMPREHENSIVE METABOLIC PANEL
ALK PHOS: 48 U/L (ref 38–126)
ALT: 359 U/L — AB (ref 17–63)
AST: 390 U/L — AB (ref 15–41)
Albumin: 3.6 g/dL (ref 3.5–5.0)
Anion gap: 4 — ABNORMAL LOW (ref 5–15)
BILIRUBIN TOTAL: 0.9 mg/dL (ref 0.3–1.2)
BUN: 11 mg/dL (ref 6–20)
CALCIUM: 9 mg/dL (ref 8.9–10.3)
CHLORIDE: 97 mmol/L — AB (ref 101–111)
CO2: 25 mmol/L (ref 22–32)
CREATININE: 0.91 mg/dL (ref 0.61–1.24)
Glucose, Bld: 176 mg/dL — ABNORMAL HIGH (ref 65–99)
Potassium: 4.8 mmol/L (ref 3.5–5.1)
Sodium: 126 mmol/L — ABNORMAL LOW (ref 135–145)
TOTAL PROTEIN: 5.8 g/dL — AB (ref 6.5–8.1)

## 2015-01-08 LAB — TROPONIN I
TROPONIN I: 0.57 ng/mL — AB (ref <0.031)
Troponin I: 0.24 ng/mL — ABNORMAL HIGH (ref <0.031)

## 2015-01-08 LAB — CBC WITH DIFFERENTIAL/PLATELET
BASOS PCT: 0 %
Basophils Absolute: 0 10*3/uL (ref 0.0–0.1)
Eosinophils Absolute: 0 10*3/uL (ref 0.0–0.7)
Eosinophils Relative: 0 %
HEMATOCRIT: 39.3 % (ref 39.0–52.0)
HEMOGLOBIN: 14.2 g/dL (ref 13.0–17.0)
LYMPHS PCT: 3 %
Lymphs Abs: 0.5 10*3/uL — ABNORMAL LOW (ref 0.7–4.0)
MCH: 32.9 pg (ref 26.0–34.0)
MCHC: 36.1 g/dL — AB (ref 30.0–36.0)
MCV: 91.2 fL (ref 78.0–100.0)
MONOS PCT: 2 %
Monocytes Absolute: 0.3 10*3/uL (ref 0.1–1.0)
NEUTROS ABS: 14.8 10*3/uL — AB (ref 1.7–7.7)
NEUTROS PCT: 95 %
Platelets: 192 10*3/uL (ref 150–400)
RBC: 4.31 MIL/uL (ref 4.22–5.81)
RDW: 11.6 % (ref 11.5–15.5)
WBC: 15.6 10*3/uL — ABNORMAL HIGH (ref 4.0–10.5)

## 2015-01-08 LAB — POCT I-STAT 7, (LYTES, BLD GAS, ICA,H+H)
Acid-base deficit: 9 mmol/L — ABNORMAL HIGH (ref 0.0–2.0)
BICARBONATE: 19.3 meq/L — AB (ref 20.0–24.0)
Calcium, Ion: 1.12 mmol/L (ref 1.12–1.23)
HCT: 40 % (ref 39.0–52.0)
Hemoglobin: 13.6 g/dL (ref 13.0–17.0)
O2 SAT: 99 %
PH ART: 7.23 — AB (ref 7.350–7.450)
PO2 ART: 138 mmHg — AB (ref 80.0–100.0)
Potassium: 3.6 mmol/L (ref 3.5–5.1)
SODIUM: 125 mmol/L — AB (ref 135–145)
TCO2: 21 mmol/L (ref 0–100)
pCO2 arterial: 45.3 mmHg — ABNORMAL HIGH (ref 35.0–45.0)

## 2015-01-08 LAB — APTT: aPTT: 26 seconds (ref 24–37)

## 2015-01-08 LAB — BRAIN NATRIURETIC PEPTIDE: B Natriuretic Peptide: 15.5 pg/mL (ref 0.0–100.0)

## 2015-01-08 LAB — PROTIME-INR
INR: 1.14 (ref 0.00–1.49)
Prothrombin Time: 14.8 seconds (ref 11.6–15.2)

## 2015-01-08 LAB — MAGNESIUM: MAGNESIUM: 1.4 mg/dL — AB (ref 1.7–2.4)

## 2015-01-08 LAB — PHOSPHORUS: PHOSPHORUS: 3.5 mg/dL (ref 2.5–4.6)

## 2015-01-08 SURGERY — EXCISION, PAROTID GLAND
Anesthesia: General | Site: Neck | Laterality: Right

## 2015-01-08 SURGERY — LEFT HEART CATH AND CORONARY ANGIOGRAPHY

## 2015-01-08 MED ORDER — LIDOCAINE HCL (CARDIAC) 20 MG/ML IV SOLN
INTRAVENOUS | Status: DC | PRN
  Administered 2015-01-08: 100 mg via INTRAVENOUS
  Administered 2015-01-08: 80 mg via INTRAVENOUS

## 2015-01-08 MED ORDER — CALCIUM CHLORIDE 10 % IV SOLN
INTRAVENOUS | Status: DC | PRN
  Administered 2015-01-08 (×2): 20 mg via INTRAVENOUS

## 2015-01-08 MED ORDER — PHENYLEPHRINE HCL 10 MG/ML IJ SOLN
INTRAMUSCULAR | Status: DC | PRN
  Administered 2015-01-08: 120 ug via INTRAVENOUS

## 2015-01-08 MED ORDER — SODIUM CHLORIDE 0.9 % IJ SOLN
3.0000 mL | Freq: Two times a day (BID) | INTRAMUSCULAR | Status: DC
  Administered 2015-01-08 – 2015-01-12 (×7): 3 mL via INTRAVENOUS

## 2015-01-08 MED ORDER — LIDOCAINE HCL (PF) 1 % IJ SOLN
INTRAMUSCULAR | Status: AC
  Filled 2015-01-08: qty 30

## 2015-01-08 MED ORDER — LACTATED RINGERS IV SOLN
INTRAVENOUS | Status: DC
  Administered 2015-01-08 (×3): via INTRAVENOUS

## 2015-01-08 MED ORDER — ACETAMINOPHEN 325 MG PO TABS
650.0000 mg | ORAL_TABLET | ORAL | Status: DC | PRN
  Administered 2015-01-10: 650 mg via ORAL
  Filled 2015-01-08: qty 2

## 2015-01-08 MED ORDER — ARTIFICIAL TEARS OP OINT
TOPICAL_OINTMENT | OPHTHALMIC | Status: DC | PRN
  Administered 2015-01-08: 1 via OPHTHALMIC

## 2015-01-08 MED ORDER — ONDANSETRON HCL 4 MG/2ML IJ SOLN: 4.0000 mg | Freq: Four times a day (QID) | INTRAMUSCULAR | Status: DC | PRN

## 2015-01-08 MED ORDER — NITROGLYCERIN 1 MG/10 ML FOR IR/CATH LAB
INTRA_ARTERIAL | Status: DC | PRN
  Administered 2015-01-08: 17:00:00

## 2015-01-08 MED ORDER — FENTANYL CITRATE (PF) 250 MCG/5ML IJ SOLN
INTRAMUSCULAR | Status: AC
  Filled 2015-01-08: qty 5

## 2015-01-08 MED ORDER — HEPARIN (PORCINE) IN NACL 2-0.9 UNIT/ML-% IJ SOLN
INTRAMUSCULAR | Status: AC
  Filled 2015-01-08: qty 1500

## 2015-01-08 MED ORDER — DEXAMETHASONE SODIUM PHOSPHATE 4 MG/ML IJ SOLN
INTRAMUSCULAR | Status: AC
  Filled 2015-01-08: qty 2

## 2015-01-08 MED ORDER — ONDANSETRON 4 MG PO TBDP
4.0000 mg | ORAL_TABLET | Freq: Four times a day (QID) | ORAL | Status: DC | PRN
  Filled 2015-01-08: qty 1

## 2015-01-08 MED ORDER — PROPOFOL 10 MG/ML IV BOLUS
INTRAVENOUS | Status: AC
  Filled 2015-01-08: qty 20

## 2015-01-08 MED ORDER — ASPIRIN 81 MG PO CHEW: 81.0000 mg | CHEWABLE_TABLET | ORAL | Status: DC

## 2015-01-08 MED ORDER — MIDAZOLAM HCL 2 MG/2ML IJ SOLN
INTRAMUSCULAR | Status: AC
  Filled 2015-01-08: qty 4

## 2015-01-08 MED ORDER — MORPHINE SULFATE (PF) 2 MG/ML IV SOLN
2.0000 mg | INTRAVENOUS | Status: DC | PRN
  Administered 2015-01-08 – 2015-01-10 (×8): 2 mg via INTRAVENOUS
  Filled 2015-01-08 (×8): qty 1

## 2015-01-08 MED ORDER — SUCCINYLCHOLINE CHLORIDE 20 MG/ML IJ SOLN
INTRAMUSCULAR | Status: DC | PRN
  Administered 2015-01-08: 140 mg via INTRAVENOUS

## 2015-01-08 MED ORDER — EPHEDRINE SULFATE 50 MG/ML IJ SOLN
INTRAMUSCULAR | Status: AC
  Filled 2015-01-08: qty 1

## 2015-01-08 MED ORDER — BACITRACIN-NEOMYCIN-POLYMYXIN OINTMENT TUBE
TOPICAL_OINTMENT | Freq: Three times a day (TID) | CUTANEOUS | Status: DC
  Administered 2015-01-08 – 2015-01-09 (×2): 1 via TOPICAL
  Administered 2015-01-09 – 2015-01-11 (×6): via TOPICAL
  Administered 2015-01-11: 1 via TOPICAL
  Administered 2015-01-11 – 2015-01-13 (×5): via TOPICAL
  Filled 2015-01-08 (×3): qty 15

## 2015-01-08 MED ORDER — FENTANYL CITRATE (PF) 100 MCG/2ML IJ SOLN
INTRAMUSCULAR | Status: DC | PRN
  Administered 2015-01-08: 50 ug via INTRAVENOUS
  Administered 2015-01-08: 100 ug via INTRAVENOUS

## 2015-01-08 MED ORDER — PANTOPRAZOLE SODIUM 40 MG IV SOLR
40.0000 mg | Freq: Every day | INTRAVENOUS | Status: DC
  Administered 2015-01-08: 40 mg via INTRAVENOUS
  Filled 2015-01-08 (×2): qty 40

## 2015-01-08 MED ORDER — SODIUM CHLORIDE 0.9 % IJ SOLN
INTRAMUSCULAR | Status: AC
  Filled 2015-01-08: qty 10

## 2015-01-08 MED ORDER — LIDOCAINE-EPINEPHRINE 1 %-1:100000 IJ SOLN
INTRAMUSCULAR | Status: DC | PRN
  Administered 2015-01-08: 10 mL

## 2015-01-08 MED ORDER — LIDOCAINE HCL (CARDIAC) 20 MG/ML IV SOLN
INTRAVENOUS | Status: AC
  Filled 2015-01-08: qty 5

## 2015-01-08 MED ORDER — METOPROLOL TARTRATE 12.5 MG HALF TABLET
12.5000 mg | ORAL_TABLET | Freq: Two times a day (BID) | ORAL | Status: DC
  Administered 2015-01-08 – 2015-01-12 (×8): 12.5 mg via ORAL
  Filled 2015-01-08 (×9): qty 1

## 2015-01-08 MED ORDER — DIAZEPAM 5 MG PO TABS
5.0000 mg | ORAL_TABLET | ORAL | Status: DC | PRN
  Administered 2015-01-09: 5 mg via ORAL
  Filled 2015-01-08: qty 1

## 2015-01-08 MED ORDER — ONDANSETRON HCL 4 MG/2ML IJ SOLN
4.0000 mg | Freq: Four times a day (QID) | INTRAMUSCULAR | Status: DC | PRN
  Administered 2015-01-08 – 2015-01-09 (×2): 4 mg via INTRAVENOUS
  Filled 2015-01-08 (×2): qty 2

## 2015-01-08 MED ORDER — ATORVASTATIN CALCIUM 80 MG PO TABS
80.0000 mg | ORAL_TABLET | Freq: Every day | ORAL | Status: DC
Start: 2015-01-08 — End: 2015-01-13
  Administered 2015-01-09 – 2015-01-12 (×4): 80 mg via ORAL
  Filled 2015-01-08 (×6): qty 1

## 2015-01-08 MED ORDER — EPINEPHRINE HCL 0.1 MG/ML IJ SOSY
PREFILLED_SYRINGE | INTRAMUSCULAR | Status: DC | PRN
  Administered 2015-01-08 (×4): 1 via INTRAVENOUS

## 2015-01-08 MED ORDER — MIDAZOLAM HCL 2 MG/2ML IJ SOLN
INTRAMUSCULAR | Status: DC | PRN
  Administered 2015-01-08: 2 mg via INTRAVENOUS

## 2015-01-08 MED ORDER — ASPIRIN 81 MG PO CHEW
81.0000 mg | CHEWABLE_TABLET | Freq: Every day | ORAL | Status: DC
  Administered 2015-01-08 – 2015-01-12 (×5): 81 mg via ORAL
  Filled 2015-01-08 (×5): qty 1

## 2015-01-08 MED ORDER — PHENYLEPHRINE HCL 10 MG/ML IJ SOLN
0.0000 ug/min | INTRAMUSCULAR | Status: DC
  Administered 2015-01-08: 5 ug/min via INTRAVENOUS
  Filled 2015-01-08: qty 1

## 2015-01-08 MED ORDER — ACETAMINOPHEN 650 MG RE SUPP: 650.0000 mg | RECTAL | Status: DC | PRN

## 2015-01-08 MED ORDER — FENTANYL CITRATE (PF) 100 MCG/2ML IJ SOLN
INTRAMUSCULAR | Status: DC | PRN
  Administered 2015-01-08: 50 ug via INTRAVENOUS

## 2015-01-08 MED ORDER — 0.9 % SODIUM CHLORIDE (POUR BTL) OPTIME
TOPICAL | Status: DC | PRN
  Administered 2015-01-08: 1000 mL

## 2015-01-08 MED ORDER — EPINEPHRINE HCL 0.1 MG/ML IJ SOSY
PREFILLED_SYRINGE | INTRAMUSCULAR | Status: AC
  Filled 2015-01-08: qty 20

## 2015-01-08 MED ORDER — SODIUM CHLORIDE 0.9 % WEIGHT BASED INFUSION: 3.0000 mL/kg/h | INTRAVENOUS | Status: DC

## 2015-01-08 MED ORDER — FENTANYL CITRATE (PF) 100 MCG/2ML IJ SOLN
INTRAMUSCULAR | Status: AC
  Filled 2015-01-08: qty 4

## 2015-01-08 MED ORDER — NICOTINE 21 MG/24HR TD PT24
21.0000 mg | MEDICATED_PATCH | Freq: Every day | TRANSDERMAL | Status: DC
  Administered 2015-01-08 – 2015-01-13 (×6): 21 mg via TRANSDERMAL
  Filled 2015-01-08 (×6): qty 1

## 2015-01-08 MED ORDER — SODIUM CHLORIDE 0.9 % IJ SOLN: 3.0000 mL | Freq: Two times a day (BID) | INTRAMUSCULAR | Status: DC

## 2015-01-08 MED ORDER — AMIODARONE HCL IN DEXTROSE 360-4.14 MG/200ML-% IV SOLN
INTRAVENOUS | Status: DC | PRN
  Administered 2015-01-08: 250 mg/h via INTRAVENOUS

## 2015-01-08 MED ORDER — CALCIUM CHLORIDE 10 % IV SOLN
INTRAVENOUS | Status: AC
  Filled 2015-01-08: qty 10

## 2015-01-08 MED ORDER — ENOXAPARIN SODIUM 40 MG/0.4ML ~~LOC~~ SOLN
40.0000 mg | Freq: Every day | SUBCUTANEOUS | Status: DC
  Administered 2015-01-08 – 2015-01-11 (×4): 40 mg via SUBCUTANEOUS
  Filled 2015-01-08 (×5): qty 0.4

## 2015-01-08 MED ORDER — SODIUM CHLORIDE 0.9 % IJ SOLN: 3.0000 mL | INTRAMUSCULAR | Status: DC | PRN

## 2015-01-08 MED ORDER — CLINDAMYCIN PHOSPHATE 600 MG/50ML IV SOLN
600.0000 mg | INTRAVENOUS | Status: AC
  Administered 2015-01-08: 600 mg via INTRAVENOUS
  Filled 2015-01-08: qty 50

## 2015-01-08 MED ORDER — SODIUM CHLORIDE 0.9 % WEIGHT BASED INFUSION: 1.0000 mL/kg/h | INTRAVENOUS | Status: DC

## 2015-01-08 MED ORDER — PHENYLEPHRINE HCL 10 MG/ML IJ SOLN
10.0000 mg | INTRAMUSCULAR | Status: DC | PRN
  Administered 2015-01-08: 10 ug/min via INTRAVENOUS

## 2015-01-08 MED ORDER — SODIUM CHLORIDE 0.9 % IV SOLN
INTRAVENOUS | Status: AC
  Administered 2015-01-08: 17:00:00 via INTRAVENOUS

## 2015-01-08 MED ORDER — MIDAZOLAM HCL 5 MG/5ML IJ SOLN
INTRAMUSCULAR | Status: DC | PRN
  Administered 2015-01-08: 2 mg via INTRAVENOUS

## 2015-01-08 MED ORDER — PROPOFOL 10 MG/ML IV BOLUS
INTRAVENOUS | Status: DC | PRN
  Administered 2015-01-08: 150 mg via INTRAVENOUS
  Administered 2015-01-08: 50 mg via INTRAVENOUS

## 2015-01-08 MED ORDER — EPHEDRINE SULFATE 50 MG/ML IJ SOLN
INTRAMUSCULAR | Status: DC | PRN
Start: 2015-01-08 — End: 2015-01-08
  Administered 2015-01-08: 5 mg via INTRAVENOUS

## 2015-01-08 MED ORDER — ROCURONIUM BROMIDE 50 MG/5ML IV SOLN
INTRAVENOUS | Status: AC
  Filled 2015-01-08: qty 1

## 2015-01-08 MED ORDER — POTASSIUM CHLORIDE IN NACL 20-0.9 MEQ/L-% IV SOLN
INTRAVENOUS | Status: DC
  Administered 2015-01-08: 14:00:00 via INTRAVENOUS
  Filled 2015-01-08: qty 1000

## 2015-01-08 MED ORDER — SODIUM CHLORIDE 0.9 % IV SOLN: 250.0000 mL | INTRAVENOUS | Status: DC | PRN

## 2015-01-08 MED ORDER — DEXAMETHASONE SODIUM PHOSPHATE 4 MG/ML IJ SOLN
INTRAMUSCULAR | Status: DC | PRN
  Administered 2015-01-08: 8 mg via INTRAVENOUS

## 2015-01-08 MED ORDER — SODIUM BICARBONATE 4.2 % IV SOLN
INTRAVENOUS | Status: DC | PRN
Start: 2015-01-08 — End: 2015-01-08
  Administered 2015-01-08: 50 meq via INTRAVENOUS

## 2015-01-08 MED ORDER — PROPOFOL 500 MG/50ML IV EMUL
INTRAVENOUS | Status: DC | PRN
  Administered 2015-01-08: 50 ug/kg/min via INTRAVENOUS

## 2015-01-08 SURGICAL SUPPLY — 47 items
ATTRACTOMAT 16X20 MAGNETIC DRP (DRAPES) ×2 IMPLANT
BLADE SURG 12 STRL SS (BLADE) IMPLANT
BLADE SURG 15 STRL LF DISP TIS (BLADE) IMPLANT
BLADE SURG 15 STRL SS (BLADE)
CANISTER SUCTION 2500CC (MISCELLANEOUS) ×2 IMPLANT
CLEANER TIP ELECTROSURG 2X2 (MISCELLANEOUS) ×2 IMPLANT
CONT SPEC 4OZ CLIKSEAL STRL BL (MISCELLANEOUS) ×2 IMPLANT
CORDS BIPOLAR (ELECTRODE) ×2 IMPLANT
COVER SURGICAL LIGHT HANDLE (MISCELLANEOUS) ×2 IMPLANT
DRAIN JACKSON PRATT 10MM FLAT (MISCELLANEOUS) IMPLANT
DRAIN JACKSON RD 7FR 3/32 (WOUND CARE) IMPLANT
DRAPE PROXIMA HALF (DRAPES) IMPLANT
ELECT COATED BLADE 2.86 ST (ELECTRODE) ×2 IMPLANT
ELECT REM PT RETURN 9FT ADLT (ELECTROSURGICAL)
ELECTRODE REM PT RTRN 9FT ADLT (ELECTROSURGICAL) IMPLANT
EVACUATOR SILICONE 100CC (DRAIN) IMPLANT
FORCEPS BIPOLAR SPETZLER 8 1.0 (NEUROSURGERY SUPPLIES) ×2 IMPLANT
GAUZE SPONGE 4X4 12PLY STRL (GAUZE/BANDAGES/DRESSINGS) ×2 IMPLANT
GAUZE SPONGE 4X4 16PLY XRAY LF (GAUZE/BANDAGES/DRESSINGS) ×2 IMPLANT
GLOVE BIO SURGEON STRL SZ 6.5 (GLOVE) ×4 IMPLANT
GLOVE SURG SS PI 7.5 STRL IVOR (GLOVE) ×4 IMPLANT
GOWN STRL REUS W/ TWL LRG LVL3 (GOWN DISPOSABLE) ×4 IMPLANT
GOWN STRL REUS W/TWL LRG LVL3 (GOWN DISPOSABLE) ×4
KIT BASIN OR (CUSTOM PROCEDURE TRAY) ×2 IMPLANT
KIT ROOM TURNOVER OR (KITS) ×2 IMPLANT
NEEDLE HYPO 25GX1X1/2 BEV (NEEDLE) IMPLANT
NS IRRIG 1000ML POUR BTL (IV SOLUTION) ×2 IMPLANT
PAD ARMBOARD 7.5X6 YLW CONV (MISCELLANEOUS) ×4 IMPLANT
PENCIL BUTTON HOLSTER BLD 10FT (ELECTRODE) ×2 IMPLANT
PROBE NERVBE PRASS .33 (MISCELLANEOUS) ×2 IMPLANT
SHEARS HARMONIC 9CM CVD (BLADE) ×2 IMPLANT
SPONGE INTESTINAL PEANUT (DISPOSABLE) ×2 IMPLANT
STAPLER VISISTAT 35W (STAPLE) IMPLANT
SUT CHROMIC 3 0 SH 27 (SUTURE) ×2 IMPLANT
SUT ETHILON 3 0 PS 1 (SUTURE) ×2 IMPLANT
SUT SILK 2 0 (SUTURE) ×1
SUT SILK 2 0 FS (SUTURE) IMPLANT
SUT SILK 2 0 SH CR/8 (SUTURE) ×2 IMPLANT
SUT SILK 2-0 18XBRD TIE 12 (SUTURE) ×1 IMPLANT
SUT VICRYL 3 0 8 18 UR 6 D8304 (SUTURE) IMPLANT
SUT VICRYL 4 0 EN S 48 D7796 (SUTURE) IMPLANT
SYR BULB 3OZ (MISCELLANEOUS) IMPLANT
SYRINGE 10CC LL (SYRINGE) ×2 IMPLANT
TAPE SURG TRANSPORE 1 IN (GAUZE/BANDAGES/DRESSINGS) ×1 IMPLANT
TAPE SURGICAL TRANSPORE 1 IN (GAUZE/BANDAGES/DRESSINGS) ×1
TRAY ENT MC OR (CUSTOM PROCEDURE TRAY) ×2 IMPLANT
TRAY FOLEY CATH 16FR SILVER (SET/KITS/TRAYS/PACK) IMPLANT

## 2015-01-08 SURGICAL SUPPLY — 7 items
CATH INFINITI 5FR MULTPACK ANG (CATHETERS) ×3 IMPLANT
KIT HEART LEFT (KITS) ×3 IMPLANT
PACK CARDIAC CATHETERIZATION (CUSTOM PROCEDURE TRAY) ×3 IMPLANT
SHEATH PINNACLE 5F 10CM (SHEATH) ×3 IMPLANT
SYR MEDRAD MARK V 150ML (SYRINGE) ×3 IMPLANT
TRANSDUCER W/STOPCOCK (MISCELLANEOUS) ×3 IMPLANT
WIRE EMERALD 3MM-J .035X150CM (WIRE) ×3 IMPLANT

## 2015-01-08 NOTE — Anesthesia Procedure Notes (Signed)
Procedure Name: Intubation Date/Time: 01/08/2015 10:16 AM Performed by: Rogers Blocker Pre-anesthesia Checklist: Patient identified, Timeout performed, Emergency Drugs available, Suction available and Patient being monitored Patient Re-evaluated:Patient Re-evaluated prior to inductionOxygen Delivery Method: Circle system utilized Preoxygenation: Pre-oxygenation with 100% oxygen Intubation Type: IV induction Ventilation: Mask ventilation without difficulty Laryngoscope Size: Mac and 4 Grade View: Grade I Tube type: Oral Tube size: 7.5 mm Number of attempts: 1 Airway Equipment and Method: Stylet Placement Confirmation: ETT inserted through vocal cords under direct vision,  breath sounds checked- equal and bilateral,  positive ETCO2 and CO2 detector Secured at: 22 cm Tube secured with: Tape Dental Injury: Teeth and Oropharynx as per pre-operative assessment

## 2015-01-08 NOTE — Progress Notes (Signed)
Site area: right groin  Site Prior to Removal:  Level 0 Pressure Applied For: 20 minutes Manual:   Yes Debbie rn  Patient Status During Pull:  stable Post Pull Site:  Level 0 Post Pull Instructions Given:  yes Post Pull Pulses Present: dp +2 Dressing Applied:  occlusive Bedrest begins @ 1720 Comments: resting comfortably

## 2015-01-08 NOTE — Interval H&P Note (Signed)
Cath Lab Visit (complete for each Cath Lab visit)  Clinical Evaluation Leading to the Procedure:   ACS: Yes.    Non-ACS:    Anginal Classification: CCS IV  Anti-ischemic medical therapy: No Therapy  Non-Invasive Test Results: No non-invasive testing performed  Prior CABG: No previous CABG      History and Physical Interval Note:  01/08/2015 4:10 PM  Leanor Rubenstein  has presented today for surgery, with the diagnosis of cp  The various methods of treatment have been discussed with the patient and family. After consideration of risks, benefits and other options for treatment, the patient has consented to  Procedure(s): Left Heart Cath and Coronary Angiography (N/A) as a surgical intervention .  The patient's history has been reviewed, patient examined, no change in status, stable for surgery.  I have reviewed the patient's chart and labs.  Questions were answered to the patient's satisfaction.     KELLY,THOMAS A

## 2015-01-08 NOTE — Progress Notes (Signed)
Lab called with elevated Troponin 0.57.  Result texted to Dr Susy Manor. Pt denies Chest pain at this time.

## 2015-01-08 NOTE — Anesthesia Postprocedure Evaluation (Signed)
  Anesthesia Post-op Note  Patient: Stephen Hays  Procedure(s) Performed: Procedure(s): ABORTED PAROTIDECTOMY (Right)  Patient Location: ICU  Anesthesia Type:General  Level of Consciousness: awake, alert  and oriented  Airway and Oxygen Therapy: Patient Spontanous Breathing and Patient connected to nasal cannula oxygen  Post-op Pain: mild, Muscular chest pain  Post-op Assessment: Post-op Vital signs reviewed and Patient's Cardiovascular Status Stable              Post-op Vital Signs: Reviewed and stable  Last Vitals:  Filed Vitals:   01/08/15 1345  BP: 105/69  Pulse: 96  Temp:   Resp: 22    Complications: Discussed OR events in detail with patient, neurologically intact, moving all extremities. Complaining of chest pain only 2/2 chest compressions. Events discussed with family in waiting room and ICU room. They have no further questions at this time.

## 2015-01-08 NOTE — Consult Note (Signed)
CARDIOLOGY CONSULT NOTE   Patient ID: Stephen Hays MRN: [id number] DOB/AGE: 50/02/1966 50 y.o.  Admit date: 01/08/2015  Primary Physician   Wende Neighbors, MD Primary Cardiologist   New Reason for Consultation  Cardiac arrest  HPI: Stephen Hays is a 49 y.o. male with a history of ongoing tobacco abuse 30 pack- years), daily ETOH use, HTN (on azilsartan-chlorthalidone), back surgery, buttock abscess s/p I&D and right parotid mass admitted on 10/14 for a planned removal of parotid mass.About 40 minutes after induction he developed VF arrest requiring 17 minutes of CPR and cardiology consulted.   The patient initially presented to [facility] on 10/1 with complaints of pain and swelling of the right face. He noted at that time that he had a "knot" there for several years but was asymptomatic. At that time, he reported an increase in size and tenderness of mass. CT of the neck at [facility] showed 3-4 cm R parotic mass.Pre-operative anesthesia work up was notable for Na 125, Cl 89, sinus tachycardia with RBBB.   The patient presented to [facility] on 01/08/15 for planned right parotid mass removal. He developed VF arrest in OR approximately 40 minutes after induction, required ACLS (epinephrine, amiodarone, bicarbonate), compressions and defib x3 before ROSC ~ 17 minutes. He was returned to the ICU post-operatively on mechanical ventilation and neo-synephrine. After arrival to the ICU, the patient self extubated by "tonguing" the ETT out per RN.He is now off neo-synephrine.   The patient is now laying comfortably in bed. He has no complaints other than chest wall pain s/p CPR for 17 minutes. He is most concerned that he cannot eat. Patient denies a cardiac history. He smokes a pack per day for about 30 years and drinks ~6 beers every night. He denies a family history of heart disease or sudden cardiac death. Both of his siblings have had strokes. His sister had a stroke a few months ago prompting him  to get his blood pressure under better control. He takes Azilsartan-chlorthalidone at home and this has been effective in managing his BP. His mother died of emphysema and his father from cancer. He says his lipids have always been "perfect." He denies a history of chest pain or SOB. 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 He has never had any exertional symptoms. He denies palpations, LE edema, orthopnea or PND. He sometimes gets a little lightheaded.    Past Medical History  Diagnosis Date  . Hypertension   . parotid mass      Past Surgical History  Procedure Laterality Date  . Back surgery    . Incision and drainage      on back near buttocks    Allergies  Allergen Reactions  . Doxycycline Nausea Only  . Prednisone Other (See Comments)    Pt stated "it makes me hateful.[name] KitchenMarland KitchenI can't stand to be around my own self"    I have reviewed the patient's current medications . aspirin  81 mg Oral Daily  . [START ON 01/09/2015] enoxaparin (LOVENOX) injection  40 mg Subcutaneous QHS  . neomycin-bacitracin-polymyxin   Topical TID  . nicotine  21 mg Transdermal Daily  . pantoprazole (PROTONIX) IV  40 mg Intravenous QHS   . 0.9 % NaCl with KCl 20 mEq / L 50 mL/hr at 01/08/15 1347  . phenylephrine (NEO-SYNEPHRINE) Adult infusion 5 mcg/min (01/08/15 1335)   acetaminophen, morphine injection, ondansetron **OR** ondansetron (ZOFRAN) IV  Prior to Admission medications  Medication Sig Start Date End Date Taking? Authorizing Provider  Azilsartan-Chlorthalidone 40-25 MG TABS Take 1 tablet by mouth daily. EDARBYCLOR   Yes Historical Provider, MD  clindamycin (CLEOCIN) 300 MG capsule Take 1 capsule (300 mg total) by mouth 4 (four) times daily. For 7 days Patient taking differently: Take 300 mg by mouth 4 (four) times daily. 7 day course started 12/27/14 12/27/14  Yes Tammy Triplett, PA-C  HYDROcodone-acetaminophen (NORCO) 7.5-325 MG tablet Take 1 tablet by mouth every 6 (six) hours as  needed for moderate pain. 12/27/14  Yes Tammy Triplett, PA-C  ibuprofen (ADVIL,MOTRIN) 800 MG tablet Take 800 mg by mouth every 8 (eight) hours as needed for mild pain.   Yes Historical Provider, MD  doxycycline (VIBRAMYCIN) 100 MG capsule Take 100 mg by mouth 2 (two) times daily with a meal. 7 day course started 12/29/14 12/29/14   Historical Provider, MD     Social History   Social History  . Marital Status: Single    Spouse Name: N/A  . Number of Children: N/A  . Years of Education: N/A   Occupational History  . Not on file.   Social History Main Topics  . Smoking status: Current Every Day Smoker -- 1.00 packs/day for 30 years  . Smokeless tobacco: Not on file  . Alcohol Use: 3.6 - 4.2 oz/week    6-7 Cans of beer per week     Comment: social  . Drug Use: No  . Sexual Activity: Not on file   Other Topics Concern  . Not on file   Social History Narrative    No family status information on file.   History reviewed. No pertinent family history.   ROS:  Full 14 point review of systems complete and found to be negative unless listed above.  Physical Exam: Blood pressure 105/69, pulse 96, temperature 98.2 F (36.8 C), temperature source Oral, resp. rate 22, weight 205 lb (92.987 kg), SpO2 100 %.  General: Well developed, well nourished, male in no acute distress Head: Eyes PERRLA, No xanthomas.   Normocephalic and atraumatic, oropharynx without edema or exudate. Dentition:  Lungs: CTAB Heart: HRRR S1 S2, no rub/gallop, Heart regular rate and rhythm with S1, S2  murmur. pulses are 2+ extrem.  Chest tender to palpation Neck: No carotid bruits. No lymphadenopathy. No JVD. Abdomen: Bowel sounds present, abdomen soft and non-tender without masses or hernias noted. Msk:  No spine or cva tenderness. No weakness, no joint deformities or effusions. Extremities: No clubbing or cyanosis.  No edema.  Neuro: Alert and oriented X 3. No focal deficits noted. Psych:  Good affect, responds  appropriately Skin: No rashes or lesions noted.  Labs:   Lab Results  Component Value Date   WBC 7.7 01/07/2015   HGB 15.6 01/07/2015   HCT 43.0 01/07/2015   MCV 91.5 01/07/2015   PLT 225 01/07/2015   No results for input(s): INR in the last 72 hours.  Recent Labs Lab 01/07/15 1335  NA 125*  K 4.7  CL 89*  CO2 26  BUN 10  CREATININE 0.92  CALCIUM 10.3  GLUCOSE 106*    Echo: pending  ECG:  HR 94 NSR. RBBB (old)  Radiology:  Dg Chest Port 1 View  01/08/2015  CLINICAL DATA:  Status post cardiopulmonary resuscitation. EXAM: PORTABLE CHEST 1 VIEW COMPARISON:  January 30, 2012. FINDINGS: The heart size and mediastinal contours are within normal limits. No pneumothorax or pleural effusion is noted. Left lung is clear. Mild right perihilar  interstitial densities are noted which may represent edema or possibly inflammation. The visualized skeletal structures are unremarkable. IMPRESSION: Mild right perihilar interstitial densities are noted concerning for edema or inflammation. Electronically Signed   By: Marijo Conception, M.D.   On: 01/08/2015 13:41    ASSESSMENT AND PLAN:    Active Problems:   Parotid mass   Stephen Hays is a 49 y.o. male with a history of ongoing tobacco abuse 30 pack- years), daily ETOH use, HTN (on azilsartan-chlorthalidone), back surgery, buttock abscess s/p I&D and right parotid mass admitted on 01/08/15 for a planned removal of parotid mass.About 40 minutes after induction he developed VF arrest requiring 17 minutes of CPR and cardiology consulted.   Ventricular fibrillation - his ECG is non ischemic post arrest. No history of chest pain or SOB. He does have RFs for CAD including long term tobacco abuse, obesity, HTN.  -- His K was noted to 3.3. Mag pending.  -- Agree with 2D ECHO . This is pending -- In the setting of VF arrest we will plan for coronary angiography today to rule out ischemia. We can do this this afternoon. I have placed pre cath  orders.   Hypotension: Resolving. Now off Neo-Synephrine. Continue to hold home BP meds  Hyponatremia: due to diuretic use vs alcohol. Placed on NS. Continue to monitor  Tobacco abuse: He knows he needs to quit this. Cessation discussed   Chronic alcohol use: Monitor in the ICU for signs of withdrawal.   Signed: Eileen Stanford, PA-C 01/08/2015 2:11 PM  Pager [phone]  Agree with assessment and plan.  Patient is a 49 year old white male with cardiac risk factors including a 30 year history of tobacco use, hypertension, and family history for cardiovascular disease with stroke in several family members.  Recently, he has been having difficulty with blood pressure control, leading to medication adjustment by his primary physician.  He developed VF arrest in the operating room today during planned right parotid mass surgery.  He underwent CPR and advanced cardiac life support and was defibrillated 3 before return of spontaneous circulation in a approximately 17 minutes.  He is unaware of any prior history of angina.  He has a history of daily EtOH use.  Will plan for cardiac catheterization today.  Patient aware of risk, benefits of the procedure , who agrees to proceed with definitive catheterization.   Troy Sine, MD, [facility] 01/08/2015 4:44 PM

## 2015-01-08 NOTE — H&P (View-Only) (Signed)
PULMONARY / CRITICAL CARE MEDICINE   Name: Stephen Hays MRN: [id number] DOB: 03-31-1965    ADMISSION DATE:  01/08/2015 CONSULTATION DATE:  12/1414  REFERRING MD :  Dr. Simeon Craft   CHIEF COMPLAINT:  Acute Respiratory Failure   INITIAL PRESENTATION: 49 y/o M with PMH of HTN and right parotid mass admitted on 10/14 for a planned removal of parotid mass.  The patient developed VF arrest in OR approximately 40 minutes after induction, required ACLS, compressions and defib x3 before ROSC.  PCCM consulted for ICU management.    STUDIES:    SIGNIFICANT EVENTS: 10/14  Admit for R parotid mass removal, VF arrest in OR, returned to ICU intubated.  Self-extubated.     HISTORY OF PRESENT ILLNESS:  49 y/o M, current smoker (1ppd x 30 years), ETOH use (1 beer per day) with PMH of HTN (on azilsartan-chlorthalidone), back surgery, buttock abscess s/p I&D and right parotid mass admitted on 10/14 for a planned removal of parotid mass.    The patient initially presented to [facility] on 10/1 with complaints of pain and swelling of the right face.  He noted at that time that he had a "knot" there for several years but was asymptomatic.  At that time, he reported an increase in size and tenderness of mass.   CT of the neck at [facility] showed 3-4 cm R parotic mass.    Pre-operative anesthesia work up was notable for Na 125, Cl 89, sinus tachycardia with RBBB.    The patient presented to [facility] on 10/14 for planned right parotid mass removal.  He developed VF arrest in OR approximately 40 minutes after induction, required ACLS (epinephrine, amiodarone, bicarbonate), compressions and defib x3 before ROSC.  He was returned to the ICU post-operatively on mechanical ventilation.  After arrival to the ICU, the patient self extubated by "tonguing" the ETT out per RN.  He remains on low dose neosynephrine.  PCCM consulted for ICU management.    Currently, the patient reports mild chest pain/soreness and burning with foley catheter.   He denies shortness of breath, pain with inspiration, abdominal pain, nausea/vomiting, diarrhea, visual disturbances or headache.    PAST MEDICAL HISTORY :   has a past medical history of Hypertension.  has past surgical history that includes Back surgery and Incision and drainage.   Prior to Admission medications   Medication Sig Start Date End Date Taking? Authorizing Provider  Azilsartan-Chlorthalidone 40-25 MG TABS Take 1 tablet by mouth daily. EDARBYCLOR   Yes Historical Provider, MD  clindamycin (CLEOCIN) 300 MG capsule Take 1 capsule (300 mg total) by mouth 4 (four) times daily. For 7 days Patient taking differently: Take 300 mg by mouth 4 (four) times daily. 7 day course started 12/27/14 12/27/14  Yes Tammy Triplett, PA-C  HYDROcodone-acetaminophen (NORCO) 7.5-325 MG tablet Take 1 tablet by mouth every 6 (six) hours as needed for moderate pain. 12/27/14  Yes Tammy Triplett, PA-C  ibuprofen (ADVIL,MOTRIN) 800 MG tablet Take 800 mg by mouth every 8 (eight) hours as needed for mild pain.   Yes Historical Provider, MD  doxycycline (VIBRAMYCIN) 100 MG capsule Take 100 mg by mouth 2 (two) times daily with a meal. 7 day course started 12/29/14 12/29/14   Historical Provider, MD   Allergies  Allergen Reactions  . Doxycycline Nausea Only  . Prednisone Other (See Comments)    Pt stated "it makes me hateful.[name] KitchenMarland KitchenI can't stand to be around my own self"    FAMILY HISTORY:  has no family  status information on file.    SOCIAL HISTORY:  reports that he has been smoking.  He does not have any smokeless tobacco history on file. He reports that he drinks about 3.6 - 4.2 oz of alcohol per week. He reports that he does not use illicit drugs.  REVIEW OF SYSTEMS:  Gen: Denies fever, chills, weight change, fatigue, night sweats HEENT: Denies blurred vision, double vision, hearing loss, tinnitus, sinus congestion, rhinorrhea, sore throat, neck stiffness, dysphagia PULM: Denies shortness of breath, cough,  sputum production, hemoptysis, wheezing CV: Denies edema, orthopnea, paroxysmal nocturnal dyspnea, palpitations.  Reports chest pain / soreness post compressions.  GI: Denies abdominal pain, nausea, vomiting, diarrhea, hematochezia, melena, constipation, change in bowel habits GU: Denies dysuria, hematuria, polyuria, oliguria, urethral discharge Endocrine: Denies hot or cold intolerance, polyuria, polyphagia or appetite change Derm: Denies rash, dry skin, scaling or peeling skin change Heme: Denies easy bruising, bleeding, bleeding gums Neuro: Denies headache, numbness, weakness, slurred speech, loss of memory or consciousness   SUBJECTIVE:   VITAL SIGNS: Temp:  [98.2 F (36.8 C)] 98.2 F (36.8 C) (10/14 1220) Pulse Rate:  [73-112] 96 (10/14 1300) Resp:  [20-22] 20 (10/14 1300) BP: (76-135)/(46-78) 80/54 mmHg (10/14 1300) SpO2:  [91 %-100 %] 98 % (10/14 1300) Arterial Line BP: (90-136)/(42-70) 92/44 mmHg (10/14 1300) Weight:  [205 lb (92.987 kg)] 205 lb (92.987 kg) (10/14 0808)   HEMODYNAMICS:     VENTILATOR SETTINGS:     INTAKE / OUTPUT:  Intake/Output Summary (Last 24 hours) at 01/08/15 1326 Last data filed at 01/08/15 1221  Gross per 24 hour  Intake   2500 ml  Output    670 ml  Net   1830 ml    PHYSICAL EXAMINATION: General:  wdwn adult male in NAD  Neuro:  AAOx4, speech clear, MAE, normal strength HEENT:  MM pink/moist, good dentition, no jvd Cardiovascular:  s1s2 rrr, no m/r/g, SR on monitor  Lungs:  resp's even/non-labored, lungs bilaterally clear Abdomen:  NTND, bsx4 active Musculoskeletal:  No acute deformities  Skin:  Warm/dry, no edema or rashes   LABS:  CBC  Recent Labs Lab 01/07/15 1335  WBC 7.7  HGB 15.6  HCT 43.0  PLT 225   Coag's No results for input(s): APTT, INR in the last 168 hours.   BMET  Recent Labs Lab 01/07/15 1335  NA 125*  K 4.7  CL 89*  CO2 26  BUN 10  CREATININE 0.92  GLUCOSE 106*   Electrolytes  Recent  Labs Lab 01/07/15 1335  CALCIUM 10.3   Sepsis Markers No results for input(s): LATICACIDVEN, PROCALCITON, O2SATVEN in the last 168 hours.   ABG No results for input(s): PHART, PCO2ART, PO2ART in the last 168 hours.   Liver Enzymes No results for input(s): AST, ALT, ALKPHOS, BILITOT, ALBUMIN in the last 168 hours.   Cardiac Enzymes No results for input(s): TROPONINI, PROBNP in the last 168 hours.   Glucose No results for input(s): GLUCAP in the last 168 hours.  Imaging No results found.   ASSESSMENT / PLAN:  CARDIOVASCULAR A:  VF Arrest - intraoperative, required shock x3 + ACLS Hypotension - post arrest, on neo  R/O MI Hx HTN  P:  Wean Neosynephrine off for MAP > 65 Cardiology consult - defer further workup to Cards  R/O MI Assess EKG, serial troponin, ECHO  ICU monitoring  ASA to begin 10/15 am   PULMONARY OETT 10/14 >> 10/14 A: RML Consolidation  Intubated for OR - s/p self  extubation 10/14 post arrest Tobacco Abuse  P:   See ID  Pulmonary hygiene  Nicotine patch  Smoking Cessation   RENAL A:   Hyponatremia - ? Related to Chlorthalidone vs ETOH use P:   Trend BMP / UOP  Replace electrolytes as indicated  NS @ 50 ml/hr Repeat CMP now  GASTROINTESTINAL / GU  A:   No acute issues  P:   NPO x4 hours then clear liquids if remains stable  PPI QHS  D/C foley   HEMATOLOGIC A:   No acute issues  P:  Trend CBC Lovenox for DVT prophylaxis   INFECTIOUS A:   Possible RML Infiltrate P:   Monitor fever curve / WBC Trend CXR   ENDOCRINE A:   No acute issues    P:   Monitor glucose on BMP   NEUROLOGIC A:   No acute issues - woke with normal mental  P:   RASS goal: n/a Serial neuro exams / monitor mental status   FAMILY  - Updates: patient and family updated on plan of care.       Noe Gens, NP-C [facility] Pgr: [phone] or if no answer [phone] 01/08/2015, 1:26 PM

## 2015-01-08 NOTE — Consult Note (Signed)
PULMONARY / CRITICAL CARE MEDICINE   Name: Stephen Hays MRN: [id number] DOB: 1965/08/16    ADMISSION DATE:  01/08/2015 CONSULTATION DATE:  12/1414  REFERRING MD :  Dr. Simeon Craft   CHIEF COMPLAINT:  Acute Respiratory Failure   INITIAL PRESENTATION: 49 y/o M with PMH of HTN and right parotid mass admitted on 10/14 for a planned removal of parotid mass.  The patient developed VF arrest in OR approximately 40 minutes after induction, required ACLS, compressions and defib x3 before ROSC.  PCCM consulted for ICU management.    STUDIES:    SIGNIFICANT EVENTS: 10/14  Admit for R parotid mass removal, VF arrest in OR, returned to ICU intubated.  Self-extubated.     HISTORY OF PRESENT ILLNESS:  49 y/o M, current smoker (1ppd x 30 years), ETOH use (1 beer per day) with PMH of HTN (on azilsartan-chlorthalidone), back surgery, buttock abscess s/p I&D and right parotid mass admitted on 10/14 for a planned removal of parotid mass.    The patient initially presented to [facility] on 10/1 with complaints of pain and swelling of the right face.  He noted at that time that he had a "knot" there for several years but was asymptomatic.  At that time, he reported an increase in size and tenderness of mass.   CT of the neck at [facility] showed 3-4 cm R parotic mass.    Pre-operative anesthesia work up was notable for Na 125, Cl 89, sinus tachycardia with RBBB.    The patient presented to [facility] on 10/14 for planned right parotid mass removal.  He developed VF arrest in OR approximately 40 minutes after induction, required ACLS (epinephrine, amiodarone, bicarbonate), compressions and defib x3 before ROSC.  He was returned to the ICU post-operatively on mechanical ventilation.  After arrival to the ICU, the patient self extubated by "tonguing" the ETT out per RN.  He remains on low dose neosynephrine.  PCCM consulted for ICU management.    Currently, the patient reports mild chest pain/soreness and burning with foley catheter.   He denies shortness of breath, pain with inspiration, abdominal pain, nausea/vomiting, diarrhea, visual disturbances or headache.    PAST MEDICAL HISTORY :   has a past medical history of Hypertension.  has past surgical history that includes Back surgery and Incision and drainage.   Prior to Admission medications   Medication Sig Start Date End Date Taking? Authorizing Provider  Azilsartan-Chlorthalidone 40-25 MG TABS Take 1 tablet by mouth daily. EDARBYCLOR   Yes Historical Provider, MD  clindamycin (CLEOCIN) 300 MG capsule Take 1 capsule (300 mg total) by mouth 4 (four) times daily. For 7 days Patient taking differently: Take 300 mg by mouth 4 (four) times daily. 7 day course started 12/27/14 12/27/14  Yes Tammy Triplett, PA-C  HYDROcodone-acetaminophen (NORCO) 7.5-325 MG tablet Take 1 tablet by mouth every 6 (six) hours as needed for moderate pain. 12/27/14  Yes Tammy Triplett, PA-C  ibuprofen (ADVIL,MOTRIN) 800 MG tablet Take 800 mg by mouth every 8 (eight) hours as needed for mild pain.   Yes Historical Provider, MD  doxycycline (VIBRAMYCIN) 100 MG capsule Take 100 mg by mouth 2 (two) times daily with a meal. 7 day course started 12/29/14 12/29/14   Historical Provider, MD   Allergies  Allergen Reactions  . Doxycycline Nausea Only  . Prednisone Other (See Comments)    Pt stated "it makes me hateful.[name] KitchenMarland KitchenI can't stand to be around my own self"    FAMILY HISTORY:  has no family  status information on file.    SOCIAL HISTORY:  reports that he has been smoking.  He does not have any smokeless tobacco history on file. He reports that he drinks about 3.6 - 4.2 oz of alcohol per week. He reports that he does not use illicit drugs.  REVIEW OF SYSTEMS:  Gen: Denies fever, chills, weight change, fatigue, night sweats HEENT: Denies blurred vision, double vision, hearing loss, tinnitus, sinus congestion, rhinorrhea, sore throat, neck stiffness, dysphagia PULM: Denies shortness of breath, cough,  sputum production, hemoptysis, wheezing CV: Denies edema, orthopnea, paroxysmal nocturnal dyspnea, palpitations.  Reports chest pain / soreness post compressions.  GI: Denies abdominal pain, nausea, vomiting, diarrhea, hematochezia, melena, constipation, change in bowel habits GU: Denies dysuria, hematuria, polyuria, oliguria, urethral discharge Endocrine: Denies hot or cold intolerance, polyuria, polyphagia or appetite change Derm: Denies rash, dry skin, scaling or peeling skin change Heme: Denies easy bruising, bleeding, bleeding gums Neuro: Denies headache, numbness, weakness, slurred speech, loss of memory or consciousness   SUBJECTIVE:   VITAL SIGNS: Temp:  [98.2 F (36.8 C)] 98.2 F (36.8 C) (10/14 1220) Pulse Rate:  [73-112] 96 (10/14 1300) Resp:  [20-22] 20 (10/14 1300) BP: (76-135)/(46-78) 80/54 mmHg (10/14 1300) SpO2:  [91 %-100 %] 98 % (10/14 1300) Arterial Line BP: (90-136)/(42-70) 92/44 mmHg (10/14 1300) Weight:  [205 lb (92.987 kg)] 205 lb (92.987 kg) (10/14 0808)   HEMODYNAMICS:     VENTILATOR SETTINGS:     INTAKE / OUTPUT:  Intake/Output Summary (Last 24 hours) at 01/08/15 1326 Last data filed at 01/08/15 1221  Gross per 24 hour  Intake   2500 ml  Output    670 ml  Net   1830 ml    PHYSICAL EXAMINATION: General:  wdwn adult male in NAD  Neuro:  AAOx4, speech clear, MAE, normal strength HEENT:  MM pink/moist, good dentition, no jvd Cardiovascular:  s1s2 rrr, no m/r/g, SR on monitor  Lungs:  resp's even/non-labored, lungs bilaterally clear Abdomen:  NTND, bsx4 active Musculoskeletal:  No acute deformities  Skin:  Warm/dry, no edema or rashes   LABS:  CBC  Recent Labs Lab 01/07/15 1335  WBC 7.7  HGB 15.6  HCT 43.0  PLT 225   Coag's No results for input(s): APTT, INR in the last 168 hours.   BMET  Recent Labs Lab 01/07/15 1335  NA 125*  K 4.7  CL 89*  CO2 26  BUN 10  CREATININE 0.92  GLUCOSE 106*   Electrolytes  Recent  Labs Lab 01/07/15 1335  CALCIUM 10.3   Sepsis Markers No results for input(s): LATICACIDVEN, PROCALCITON, O2SATVEN in the last 168 hours.   ABG No results for input(s): PHART, PCO2ART, PO2ART in the last 168 hours.   Liver Enzymes No results for input(s): AST, ALT, ALKPHOS, BILITOT, ALBUMIN in the last 168 hours.   Cardiac Enzymes No results for input(s): TROPONINI, PROBNP in the last 168 hours.   Glucose No results for input(s): GLUCAP in the last 168 hours.  Imaging No results found.   ASSESSMENT / PLAN:  CARDIOVASCULAR A:  VF Arrest - intraoperative, required shock x3 + ACLS Hypotension - post arrest, on neo  R/O MI Hx HTN  P:  Wean Neosynephrine off for MAP > 65 Cardiology consult - defer further workup to Cards  R/O MI Assess EKG, serial troponin, ECHO  ICU monitoring  ASA to begin 10/15 am   PULMONARY OETT 10/14 >> 10/14 A: RML Consolidation  Intubated for OR - s/p self  extubation 10/14 post arrest Tobacco Abuse  P:   See ID  Pulmonary hygiene  Nicotine patch  Smoking Cessation   RENAL A:   Hyponatremia - ? Related to Chlorthalidone vs ETOH use P:   Trend BMP / UOP  Replace electrolytes as indicated  NS @ 50 ml/hr Repeat CMP now  GASTROINTESTINAL / GU  A:   No acute issues  P:   NPO x4 hours then clear liquids if remains stable  PPI QHS  D/C foley   HEMATOLOGIC A:   No acute issues  P:  Trend CBC Lovenox for DVT prophylaxis   INFECTIOUS A:   Possible RML Infiltrate P:   Monitor fever curve / WBC Trend CXR   ENDOCRINE A:   No acute issues    P:   Monitor glucose on BMP   NEUROLOGIC A:   No acute issues - woke with normal mental  P:   RASS goal: n/a Serial neuro exams / monitor mental status   FAMILY  - Updates: patient and family updated on plan of care.       Noe Gens, NP-C [facility] Pgr: [phone] or if no answer [phone] 01/08/2015, 1:26 PM

## 2015-01-08 NOTE — Anesthesia Preprocedure Evaluation (Addendum)
Anesthesia Evaluation  Patient identified by MRN, date of birth, ID band Patient awake    Reviewed: Allergy & Precautions, NPO status , Patient's Chart, lab work & pertinent test results  Airway Mallampati: III  TM Distance: >3 FB Neck ROM: Full    Dental  (+) Teeth Intact   Pulmonary Current Smoker,    breath sounds clear to auscultation       Cardiovascular hypertension, Pt. on medications  Rhythm:Regular Rate:Normal     Neuro/Psych negative neurological ROS  negative psych ROS   GI/Hepatic negative GI ROS, Neg liver ROS,   Endo/Other  negative endocrine ROS  Renal/GU negative Renal ROS  negative genitourinary   Musculoskeletal negative musculoskeletal ROS (+)   Abdominal   Peds negative pediatric ROS (+)  Hematology negative hematology ROS (+)   Anesthesia Other Findings   Reproductive/Obstetrics negative OB ROS                            Lab Results  Component Value Date   WBC 7.7 01/07/2015   HGB 15.6 01/07/2015   HCT 43.0 01/07/2015   MCV 91.5 01/07/2015   PLT 225 01/07/2015   Lab Results  Component Value Date   CREATININE 0.92 01/07/2015   BUN 10 01/07/2015   NA 125* 01/07/2015   K 4.7 01/07/2015   CL 89* 01/07/2015   CO2 26 01/07/2015   No results found for: INR, PROTIME  EKG: sinus tachycardia, RBBB.   Anesthesia Physical Anesthesia Plan  ASA: II  Anesthesia Plan: General   Post-op Pain Management:    Induction: Intravenous  Airway Management Planned: Oral ETT  Additional Equipment:   Intra-op Plan:   Post-operative Plan: Extubation in OR  Informed Consent: I have reviewed the patients History and Physical, chart, labs and discussed the procedure including the risks, benefits and alternatives for the proposed anesthesia with the patient or authorized representative who has indicated his/her understanding and acceptance.   Dental advisory  given  Plan Discussed with: CRNA  Anesthesia Plan Comments:         Anesthesia Quick Evaluation

## 2015-01-08 NOTE — Progress Notes (Signed)
Subjective: S/p aborted right parotidectomy. Patient self-extubated and is awake and conversant on facemask oxygen. In sinus rhythm, no evidence of return of the ventricular fibrillation.  Objective: Vital signs in last 24 hours: Temp:  [97.9 F (36.6 C)-98.2 F (36.8 C)] 98.2 F (36.8 C) (10/14 1220) Pulse Rate:  [73-112] 96 (10/14 1300) Resp:  [20-22] 20 (10/14 1300) BP: (76-135)/(46-78) 80/54 mmHg (10/14 1300) SpO2:  [91 %-100 %] 98 % (10/14 1300) Arterial Line BP: (90-136)/(42-70) 92/44 mmHg (10/14 1300) Weight:  [92.987 kg (205 lb)-93.123 kg (205 lb 4.8 oz)] 92.987 kg (205 lb) (10/14 0808)  Filed Vitals:   01/08/15 1300  BP: 80/54  Pulse: 96  Temp:   Resp: 20  awake, alert, moves all four extremities purposefully. Facial nerve intact and House-Brackmann 1/6 bilaterally in all divisions. Right modified Blair incision intact with 3-0 chromic gut sutures in place and pressure dressing. Stable 3-4 cm right inferior parotid mass palpable. Strong voice, no stridor or stertor.  @LABLAST2 (wbc:2,hgb:2,hct:2,plt:2)  Recent Labs  01/07/15 1335  NA 125*  K 4.7  CL 89*  CO2 26  GLUCOSE 106*  BUN 10  CREATININE 0.92  CALCIUM 10.3    Medications:  Scheduled Meds: . [START ON 01/09/2015] enoxaparin (LOVENOX) injection  40 mg Subcutaneous QHS  . neomycin-bacitracin-polymyxin   Topical TID  . pantoprazole (PROTONIX) IV  40 mg Intravenous QHS   Continuous Infusions: . 0.9 % NaCl with KCl 20 mEq / L    . lactated ringers 50 mL/hr at 01/08/15 0923  . phenylephrine (NEO-SYNEPHRINE) Adult infusion     PRN Meds:.acetaminophen, morphine injection, ondansetron **OR** ondansetron (ZOFRAN) IV  Assessment/Plan: S/p aborted right superficial parotidectomy for right parotid mass. procedure was aborted due to intraoperative ventricular fibrillation which required compressions, bicarb/amiodarone/epinephrine, and defibrillation to convert back to normal sinus rhythm. Patient is now stable and  awake and conversant. I explained to the patient and his family that it was not safe to complete the nerve dissection and tumor excision after his serious cardiac event/Vfib, and we will need to defer any further surgery until he has been worked up from a cardiac standpoint. I consulted cardiology and critical care and I greatly appreciate their assistance and input. Will monitor closely.   LOS: 0 days   Ruby Cola 01/08/2015, 1:11 PM

## 2015-01-08 NOTE — Transfer of Care (Signed)
Immediate Anesthesia Transfer of Care Note  Patient: Stephen Hays  Procedure(s) Performed: Procedure(s): ABORTED PAROTIDECTOMY (Right)  Patient Location: SICU  Anesthesia Type:General  Level of Consciousness: sedated and responds to stimulation  Airway & Oxygen Therapy: Patient Spontanous Breathing Pt on ventilator per RT management.  Post-op Assessment: Report given to RN, Post -op Vital signs reviewed and stable and Patient moving all extremities X 4  Post vital signs: Reviewed and stable  Last Vitals:  Filed Vitals:   01/08/15 0808  BP: 135/78  Pulse: 73  Temp: 36.8 C  Resp: 20    Complications: Post CPR ICU admission

## 2015-01-08 NOTE — Op Note (Signed)
01/08/2015 Surgeon: Ruby Cola Assistant:Louise Sallee Provencal, PA-C Procedure performed: aborted right superficial parotidectomy Preoperative Diagnosis: right parotid mass Postoperative Diagnosis: right parotid  mass EBL: less than 14mL Anesthesia: General Via Endotracheal Complications: Ventricular fibrillation requiring compressions, epinephrine, amiodarone, bicarbonate, and defibrillation, converted back to sinus rhythm, surgery aborted. Operative Findings: intact facial nerve in all divisions, intact great auricular nerve, ~ 3 to 4cm right inferior parotid mass  Operative Details: The patient was prepped and draped. A standard modified Blair incision was made on the right side with the incision extending behind the earlobe and down slightly into the neck. Anterior and posterior supraplatysmal skin flaps were developed. The parotidomasseteric fascia overlying the parotid gland was left intact. The great auricular nerve was identified and left intact inferior to the parotid mass. Posteriorly, the mastoid process and the anterior edge of the sternocleidomastoid were identified. With blunt dissection, a small curved clamp was used to separate the gland from the mastoid process and the sternocleidomastoid muscle. The cartilage of the external auditory canal and the tragal pointer were identified. Bleeding was treated with bipolar cautery as needed. Using a mosquito clamp the temporoparotid fascia was carefully elevated and transsected. Then the main trunk of the nerve was visualized coming out of the stylomastoid foramen and the main trunk was identified. Just as we were about to begin dissecting out the main trunk and the facial nerve branches, the patient spontaneously went into ventricular fibrillation requiring compressions, epinephrine, amiodarone, bicarbonate, and defibrillation. After the third defibrillation the patient converted back to sinus rhythm. We aborted the parotidectomy and tumor removal  given the cardiac issues and the skin was closed expeditiously with interrupted 3-0 chromic sutures. A pressure dressing was applied and hemostasis was spontaneous. The patient left the OR in stable condition and was taken directly to the ICU.      Dr. Thomes Lolling MDPhD was present and performed the entire procedure.  Ruby Cola 01/08/2015  12:16 PM

## 2015-01-09 ENCOUNTER — Inpatient Hospital Stay (HOSPITAL_COMMUNITY): Payer: BLUE CROSS/BLUE SHIELD

## 2015-01-09 DIAGNOSIS — I469 Cardiac arrest, cause unspecified: Secondary | ICD-10-CM

## 2015-01-09 LAB — COMPREHENSIVE METABOLIC PANEL
ALT: 249 U/L — ABNORMAL HIGH (ref 17–63)
AST: 175 U/L — AB (ref 15–41)
Albumin: 3.6 g/dL (ref 3.5–5.0)
Alkaline Phosphatase: 46 U/L (ref 38–126)
Anion gap: 10 (ref 5–15)
BILIRUBIN TOTAL: 0.7 mg/dL (ref 0.3–1.2)
BUN: 12 mg/dL (ref 6–20)
CO2: 23 mmol/L (ref 22–32)
Calcium: 9.1 mg/dL (ref 8.9–10.3)
Chloride: 98 mmol/L — ABNORMAL LOW (ref 101–111)
Creatinine, Ser: 0.87 mg/dL (ref 0.61–1.24)
GFR calc Af Amer: >60 mL/min (ref >60)
GFR calc non Af Amer: >60 mL/min (ref >60)
GLUCOSE: 139 mg/dL — AB (ref 65–99)
POTASSIUM: 3.8 mmol/L (ref 3.5–5.1)
Sodium: 131 mmol/L — ABNORMAL LOW (ref 135–145)
TOTAL PROTEIN: 6.2 g/dL — AB (ref 6.5–8.1)

## 2015-01-09 LAB — CBC
HCT: 39.4 % (ref 39.0–52.0)
HEMATOCRIT: 38.6 % — AB (ref 39.0–52.0)
Hemoglobin: 13.8 g/dL (ref 13.0–17.0)
Hemoglobin: 14.2 g/dL (ref 13.0–17.0)
MCH: 32.9 pg (ref 26.0–34.0)
MCH: 33.3 pg (ref 26.0–34.0)
MCHC: 35.8 g/dL (ref 30.0–36.0)
MCHC: 36 g/dL (ref 30.0–36.0)
MCV: 92.1 fL (ref 78.0–100.0)
MCV: 92.3 fL (ref 78.0–100.0)
PLATELETS: 199 10*3/uL (ref 150–400)
Platelets: 200 10*3/uL (ref 150–400)
RBC: 4.19 MIL/uL — AB (ref 4.22–5.81)
RBC: 4.27 MIL/uL (ref 4.22–5.81)
RDW: 11.6 % (ref 11.5–15.5)
RDW: 11.9 % (ref 11.5–15.5)
WBC: 10.9 10*3/uL — AB (ref 4.0–10.5)
WBC: 13.3 10*3/uL — AB (ref 4.0–10.5)

## 2015-01-09 LAB — COMPREHENSIVE METABOLIC PANEL WITH GFR
ALT: 265 U/L — ABNORMAL HIGH (ref 17–63)
AST: 216 U/L — ABNORMAL HIGH (ref 15–41)
Albumin: 3.5 g/dL (ref 3.5–5.0)
Alkaline Phosphatase: 45 U/L (ref 38–126)
Anion gap: 8 (ref 5–15)
BUN: 13 mg/dL (ref 6–20)
CO2: 24 mmol/L (ref 22–32)
Calcium: 9.2 mg/dL (ref 8.9–10.3)
Chloride: 98 mmol/L — ABNORMAL LOW (ref 101–111)
Creatinine, Ser: 0.96 mg/dL (ref 0.61–1.24)
GFR calc Af Amer: >60 mL/min
GFR calc non Af Amer: >60 mL/min
Glucose, Bld: 200 mg/dL — ABNORMAL HIGH (ref 65–99)
Potassium: 4 mmol/L (ref 3.5–5.1)
Sodium: 130 mmol/L — ABNORMAL LOW (ref 135–145)
Total Bilirubin: 0.6 mg/dL (ref 0.3–1.2)
Total Protein: 6 g/dL — ABNORMAL LOW (ref 6.5–8.1)

## 2015-01-09 LAB — TROPONIN I: TROPONIN I: 0.52 ng/mL — AB (ref <0.031)

## 2015-01-09 LAB — MAGNESIUM
MAGNESIUM: 1.5 mg/dL — AB (ref 1.7–2.4)
Magnesium: 1.4 mg/dL — ABNORMAL LOW (ref 1.7–2.4)

## 2015-01-09 LAB — PHOSPHORUS
Phosphorus: 3.2 mg/dL (ref 2.5–4.6)
Phosphorus: 3.9 mg/dL (ref 2.5–4.6)

## 2015-01-09 MED ORDER — MAGNESIUM SULFATE 2 GM/50ML IV SOLN
2.0000 g | Freq: Once | INTRAVENOUS | Status: AC
  Administered 2015-01-09: 2 g via INTRAVENOUS
  Filled 2015-01-09: qty 50

## 2015-01-09 MED ORDER — PANTOPRAZOLE SODIUM 40 MG PO TBEC
40.0000 mg | DELAYED_RELEASE_TABLET | Freq: Every day | ORAL | Status: DC
  Administered 2015-01-09 – 2015-01-12 (×4): 40 mg via ORAL
  Filled 2015-01-09 (×4): qty 1

## 2015-01-09 MED ORDER — PERFLUTREN LIPID MICROSPHERE
1.0000 mL | INTRAVENOUS | Status: AC | PRN
  Administered 2015-01-09: 2 mL via INTRAVENOUS
  Filled 2015-01-09: qty 10

## 2015-01-09 NOTE — Progress Notes (Signed)
PULMONARY / CRITICAL CARE MEDICINE   Name: Stephen Hays MRN: [id number] DOB: 04/22/1965    ADMISSION DATE:  01/08/2015 CONSULTATION DATE:  12/1414  REFERRING MD :  Dr. Simeon Craft   CHIEF COMPLAINT:  Acute Respiratory Failure   INITIAL PRESENTATION: 49 y/o M with PMH of HTN and right parotid mass admitted on 10/14 for a planned removal of parotid mass.  The patient developed VF arrest in OR approximately 40 minutes after induction, required ACLS, compressions and defib x3 before ROSC.  PCCM consulted for ICU management.    STUDIES:    SIGNIFICANT EVENTS: 10/14  Admit for R parotid mass removal, VF arrest in OR, returned to ICU intubated.  Self-extubated.     HISTORY OF PRESENT ILLNESS:  48 y/o M, current smoker (1ppd x 30 years), ETOH use (1 beer per day) with PMH of HTN (on azilsartan-chlorthalidone), back surgery, buttock abscess s/p I&D and right parotid mass admitted on 10/14 for a planned removal of parotid mass.    The patient initially presented to [facility] on 10/1 with complaints of pain and swelling of the right face.  He noted at that time that he had a "knot" there for several years but was asymptomatic.  At that time, he reported an increase in size and tenderness of mass.   CT of the neck at [facility] showed 3-4 cm R parotic mass.    Pre-operative anesthesia work up was notable for Na 125, Cl 89, sinus tachycardia with RBBB.    The patient presented to [facility] on 10/14 for planned right parotid mass removal.  He developed VF arrest in OR approximately 40 minutes after induction, required ACLS (epinephrine, amiodarone, bicarbonate), compressions and defib x3 before ROSC.  He was returned to the ICU post-operatively on mechanical ventilation.  After arrival to the ICU, the patient self extubated by "tonguing" the ETT out per RN.  He remains on low dose neosynephrine.  PCCM consulted for ICU management.    SUBJECTIVE:  C/o chest soreness Afebrile oob to chair  VITAL SIGNS: Temp:  [97.6 F  (36.4 C)-98.8 F (37.1 C)] 98.6 F (37 C) (10/15 1100) Pulse Rate:  [0-110] 71 (10/15 1300) Resp:  [0-22] 17 (10/15 1300) BP: (101-147)/(61-133) 112/61 mmHg (10/15 1200) SpO2:  [0 %-100 %] 100 % (10/15 1300) Arterial Line BP: (81-165)/(46-93) 106/78 mmHg (10/15 1300) Weight:  [93.078 kg (205 lb 3.2 oz)-93.12 kg (205 lb 4.7 oz)] 93.078 kg (205 lb 3.2 oz) (10/15 0430)   HEMODYNAMICS:     VENTILATOR SETTINGS:     INTAKE / OUTPUT:  Intake/Output Summary (Last 24 hours) at 01/09/15 1336 Last data filed at 01/09/15 1300  Gross per 24 hour  Intake 3047.5 ml  Output   3450 ml  Net -402.5 ml    PHYSICAL EXAMINATION: General:  wdwn adult male in NAD  Neuro:  AAOx4, speech clear, MAE, normal strength HEENT:  MM pink/moist, good dentition, no jvd Cardiovascular:  s1s2 rrr, no m/r/g, SR on monitor  Lungs:  resp's even/non-labored, lungs bilaterally clear Abdomen:  NTND, bsx4 active Musculoskeletal:  No acute deformities  Skin:  Warm/dry, no edema or rashes   LABS:  CBC  Recent Labs Lab 01/08/15 1239 01/09/15 0147 01/09/15 0442  WBC 15.6* 10.9* 13.3*  HGB 14.2 13.8 14.2  HCT 39.3 38.6* 39.4  PLT 192 200 199   Coag's  Recent Labs Lab 01/08/15 1239  APTT 26  INR 1.14     BMET  Recent Labs Lab 01/08/15 1239 01/09/15 0147  01/09/15 0442  NA 126* 130* 131*  K 4.8 4.0 3.8  CL 97* 98* 98*  CO2 25 24 23   BUN 11 13 12   CREATININE 0.91 0.96 0.87  GLUCOSE 176* 200* 139*   Electrolytes  Recent Labs Lab 01/08/15 1239 01/09/15 0147 01/09/15 0442  CALCIUM 9.0 9.2 9.1  MG 1.4* 1.5* 1.4*  PHOS 3.5 3.2 3.9   Sepsis Markers No results for input(s): LATICACIDVEN, PROCALCITON, O2SATVEN in the last 168 hours.   ABG  Recent Labs Lab 01/08/15 1129  PHART 7.230*  PCO2ART 45.3*  PO2ART 138.0*     Liver Enzymes  Recent Labs Lab 01/08/15 1239 01/09/15 0147 01/09/15 0442  AST 390* 216* 175*  ALT 359* 265* 249*  ALKPHOS 48 45 46  BILITOT 0.9 0.6 0.7   ALBUMIN 3.6 3.5 3.6     Cardiac Enzymes  Recent Labs Lab 01/08/15 1242 01/08/15 1949 01/09/15 0147  TROPONINI 0.24* 0.57* 0.52*     Glucose No results for input(s): GLUCAP in the last 168 hours.  Imaging No results found.   ASSESSMENT / PLAN:  CARDIOVASCULAR A:  VF Arrest - intraoperative, required shock x3 + ACLS Hypotension - post arrest, on neo  R/O MI Hx HTN  P:  Cardiology consult ed -cath neg ASA   PULMONARY OETT 10/14 >> 10/14 A: RML Consolidation  Intubated for OR - s/p self extubation 10/14 post arrest Tobacco Abuse  P:   Pulmonary hygiene  Nicotine patch  Smoking Cessation   RENAL A:   Hyponatremia - ? Related to Chlorthalidone vs ETOH use hypomag P:   Trend BMP / UOP  Replace electrolytes as indicated     INFECTIOUS A:   Possible RML Infiltrate P:   No Abx required    FAMILY  - Updates: patient and family updated on plan of care.    OK to dc from our standpoint Would ask anesthesia for recommendations if surgery required again  PCCM to sign off  Kara Mead MD. Shade Flood. [facility] Pager [phone] If no response call [phone]    01/09/2015, 1:36 PM

## 2015-01-09 NOTE — Consult Note (Signed)
Primary Care Physician: Wende Neighbors, MD Referring Physician:  Admit Date: 01/08/2015  Reason for consultation:  Stephen Hays is a 49 y.o. male with a h/o ongoing tobacco abuse 30 pack- years), daily ETOH use, HTN (on azilsartan-chlorthalidone), back surgery, buttock abscess s/p I&D and right parotid mass admitted on 10/14 for a planned removal of parotid mass.About 40 minutes after induction he developed VF arrest requiring 17 minutes of CPR and cardiology consulted.   The patient presented to [facility] on 01/08/15 for planned right parotid mass removal. He developed VF arrest in OR approximately 40 minutes after induction, required ACLS (epinephrine, amiodarone, bicarbonate), compressions and defib x3 before ROSC ~ 17 minutes. He was returned to the ICU post-operatively on mechanical ventilation and neo-synephrine. After arrival to the ICU, the patient self extubated by "tonguing" the ETT out per RN.He is now off neo-synephrine.   Patient denies a cardiac history. He smokes a pack per day for about 30 years and drinks ~6 beers every night. He denies a family history of heart disease or sudden cardiac death. Both of his siblings have had strokes. His sister had a stroke a few months ago prompting him to get his blood pressure under better control. He takes Azilsartan-chlorthalidone at home and this has been effective in managing his BP. His mother died of emphysema and his father from cancer. He says his lipids have always been "perfect." He denies a history of chest pain or SOB. 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 He has never had any exertional symptoms. He denies palpations, LE edema, orthopnea or PND. He sometimes gets a little lightheaded.  Today, he denies symptoms of palpitations, chest pain, shortness of breath, orthopnea, PND, lower extremity edema, dizziness, presyncope, syncope, or neurologic sequela. The patient is tolerating medications without difficulties and is  otherwise without complaint today.   Past Medical History  Diagnosis Date  . Hypertension   . parotid mass    Past Surgical History  Procedure Laterality Date  . Back surgery    . Incision and drainage      on back near buttocks    . aspirin  81 mg Oral Daily  . atorvastatin  80 mg Oral q1800  . enoxaparin (LOVENOX) injection  40 mg Subcutaneous QHS  . magnesium sulfate 1 - 4 g bolus IVPB  2 g Intravenous Once  . metoprolol tartrate  12.5 mg Oral BID  . neomycin-bacitracin-polymyxin   Topical TID  . nicotine  21 mg Transdermal Daily  . pantoprazole  40 mg Oral QHS  . sodium chloride  3 mL Intravenous Q12H   . phenylephrine (NEO-SYNEPHRINE) Adult infusion Stopped (01/08/15 1400)    Allergies  Allergen Reactions  . Doxycycline Nausea Only  . Prednisone Other (See Comments)    Pt stated "it makes me hateful.[name] KitchenMarland KitchenI can't stand to be around my own self"    Social History   Social History  . Marital Status: Single    Spouse Name: N/A  . Number of Children: N/A  . Years of Education: N/A   Occupational History  . Not on file.   Social History Main Topics  . Smoking status: Current Every Day Smoker -- 1.00 packs/day for 30 years  . Smokeless tobacco: Not on file  . Alcohol Use: 3.6 - 4.2 oz/week    6-7 Cans of beer per week     Comment: social  . Drug Use: No  . Sexual Activity: Not on file   Other  Topics Concern  . Not on file   Social History Narrative    History reviewed. No pertinent family history.  ROS- All systems are reviewed and negative except as per the HPI above  Physical Exam: Telemetry: Filed Vitals:   01/09/15 1000 01/09/15 1100 01/09/15 1200 01/09/15 1300  BP: 147/133  112/61   Pulse: 86 71 68 71  Temp:  98.6 F (37 C)    TempSrc:  Oral    Resp: 17 13 17 17   Height:      Weight:      SpO2: 97% 96% 99% 100%    GEN- The patient is well appearing, alert and oriented x 3 today.   Head- normocephalic, atraumatic Eyes-  Sclera clear,  conjunctiva pink Ears- hearing intact Oropharynx- clear Neck- supple, no JVP Lymph- no cervical lymphadenopathy Lungs- Clear to ausculation bilaterally, normal work of breathing Heart- Regular rate and rhythm, no murmurs, rubs or gallops, PMI not laterally displaced GI- soft, NT, ND, + BS Extremities- no clubbing, cyanosis, or edema MS- no significant deformity or atrophy Skin- no rash or lesion Psych- euthymic mood, full affect Neuro- strength and sensation are intact  EKG- Sinus rhythm, RBBB  Labs:   Lab Results  Component Value Date   WBC 13.3* 01/09/2015   HGB 14.2 01/09/2015   HCT 39.4 01/09/2015   MCV 92.3 01/09/2015   PLT 199 01/09/2015    Recent Labs Lab 01/09/15 0442  NA 131*  K 3.8  CL 98*  CO2 23  BUN 12  CREATININE 0.87  CALCIUM 9.1  PROT 6.2*  BILITOT 0.7  ALKPHOS 46  ALT 249*  AST 175*  GLUCOSE 139*   Lab Results  Component Value Date   TROPONINI 0.52* 01/09/2015   No results found for: CHOL No results found for: HDL No results found for: LDLCALC No results found for: TRIG No results found for: CHOLHDL No results found for: LDLDIRECT    Radiology: CXR 01/08/15 Mild right perihilar interstitial densities are noted concerning for edema or inflammation.  Cardiac Cath 01/08/15  Prox LAD lesion, 30% stenosed.  Mid LAD lesion, 15% stenosed.  The left ventricular systolic function is normal.  Normal LV function with an ejection fraction of at least 60-65% but with a focal region of apical ballooning.  Mild nonobstructive CAD with smooth 30% ostial LAD stenosis, calcified proximal to mid LAD with 10 -20% narrowing and an LAD which becomes small caliber in the apical segment without obstructive stenosis; normal left circumflex and normal dominant RCA.   ASSESSMENT AND PLAN:   1. Cardiac Arrest: Unknown cause as he was in surgery at the time and strips are not in Epic.  Has normal cath and normal LV gram.  Echo has not yet been read.  By  the procedure notes, it seems that the VF was spontaneous and that he had been on stable medications prior to the event without a new medication or enticing event.  Had discussion with the patient about an ICD.  He is interested.  Echo pending.  Will discuss further post TTE.  Will Meredith Leeds, MD 01/09/2015  1:56 PM

## 2015-01-09 NOTE — Progress Notes (Signed)
Patient ID: Stephen Hays, male   DOB: 04-10-1965, 49 y.o.   MRN: [id number] Subjective: Awake and alert without complaints.  Objective: Vital signs in last 24 hours: Temp:  [97.6 F (36.4 C)-98.8 F (37.1 C)] 98.8 F (37.1 C) (10/15 0400) Pulse Rate:  [0-112] 68 (10/15 0700) Resp:  [0-23] 15 (10/15 0700) BP: (76-143)/(46-103) 115/76 mmHg (10/15 0700) SpO2:  [0 %-100 %] 100 % (10/15 0700) Arterial Line BP: (81-165)/(42-88) 115/57 mmHg (10/15 0700) Weight:  [93.078 kg (205 lb 3.2 oz)-93.12 kg (205 lb 4.7 oz)] 93.078 kg (205 lb 3.2 oz) (10/15 0430) Weight change:     Intake/Output from previous day: 10/14 0701 - 10/15 0700 In: 4857.5 [P.O.:720; I.V.:4137.5] Out: 4570 [Urine:4550; Blood:20] Intake/Output this shift:    PHYSICAL EXAM: Awake and alert. Facial dressing removed. No hematoma, incision intact.  Lab Results:  Recent Labs  01/09/15 0147 01/09/15 0442  WBC 10.9* 13.3*  HGB 13.8 14.2  HCT 38.6* 39.4  PLT 200 199   BMET  Recent Labs  01/09/15 0147 01/09/15 0442  NA 130* 131*  K 4.0 3.8  CL 98* 98*  CO2 24 23  GLUCOSE 200* 139*  BUN 13 12  CREATININE 0.96 0.87  CALCIUM 9.2 9.1    Studies/Results: Dg Chest Port 1 View  01/08/2015  CLINICAL DATA:  Status post cardiopulmonary resuscitation. EXAM: PORTABLE CHEST 1 VIEW COMPARISON:  January 30, 2012. FINDINGS: The heart size and mediastinal contours are within normal limits. No pneumothorax or pleural effusion is noted. Left lung is clear. Mild right perihilar interstitial densities are noted which may represent edema or possibly inflammation. The visualized skeletal structures are unremarkable. IMPRESSION: Mild right perihilar interstitial densities are noted concerning for edema or inflammation. Electronically Signed   By: Marijo Conception, M.D.   On: 01/08/2015 13:41    Medications: I have reviewed the patient's current medications.  Assessment/Plan: Doing well. No cardiac symptoms. Will await input  from CCM and Cardiol and discharge home when appropriate.   LOS: 1 day   Montie Swiderski 01/09/2015, 8:21 AM

## 2015-01-09 NOTE — Progress Notes (Signed)
  Echocardiogram 2D Echocardiogram with Definity has been performed.  Diamond Nickel 01/09/2015, 3:24 PM

## 2015-01-10 MED ORDER — MUPIROCIN 2 % EX OINT
TOPICAL_OINTMENT | CUTANEOUS | Status: AC
  Filled 2015-01-10: qty 22

## 2015-01-10 MED ORDER — HYDROCODONE-ACETAMINOPHEN 7.5-325 MG PO TABS
1.0000 | ORAL_TABLET | Freq: Four times a day (QID) | ORAL | Status: DC | PRN
  Administered 2015-01-10 – 2015-01-11 (×4): 2 via ORAL
  Administered 2015-01-11: 1 via ORAL
  Filled 2015-01-10: qty 1
  Filled 2015-01-10 (×4): qty 2

## 2015-01-10 NOTE — Progress Notes (Signed)
Patient ID: Stephen Hays, male   DOB: [birth year], 49 y.o.   MRN: [id number]  He is doing great over all. No complaints.  EP Cardiologist will determine if he could benefit with ICD. Will keep in monitored bed until then. Will transfer to Telemetry unit.  Incision intact, no infection.

## 2015-01-10 NOTE — Consult Note (Signed)
SUBJECTIVE: The patient is doing well today.  At this time, he denies chest pain, shortness of breath, or any new concerns.  Had echo yesterday which shows normal EF, no valvular heart disease.  [name] Kitchen aspirin  81 mg Oral Daily  . atorvastatin  80 mg Oral q1800  . enoxaparin (LOVENOX) injection  40 mg Subcutaneous QHS  . metoprolol tartrate  12.5 mg Oral BID  . neomycin-bacitracin-polymyxin   Topical TID  . nicotine  21 mg Transdermal Daily  . pantoprazole  40 mg Oral QHS  . sodium chloride  3 mL Intravenous Q12H   . phenylephrine (NEO-SYNEPHRINE) Adult infusion Stopped (01/08/15 1400)    OBJECTIVE: Physical Exam: Filed Vitals:   01/09/15 2116 01/09/15 2335 01/10/15 0427 01/10/15 0800  BP:  133/88 120/79 130/95  Pulse: 82 77 74 75  Temp:  97.8 F (36.6 C) 98.1 F (36.7 C) 97.6 F (36.4 C)  TempSrc:  Oral Oral Oral  Resp:  18 17 19   Height:      Weight:      SpO2:  100% 100% 93%    Intake/Output Summary (Last 24 hours) at 01/10/15 1042 Last data filed at 01/10/15 0600  Gross per 24 hour  Intake   1670 ml  Output    600 ml  Net   1070 ml    Telemetry reveals sinus rhythm  GEN- The patient is well appearing, alert and oriented x 3 today.   Head- normocephalic, atraumatic Eyes-  Sclera clear, conjunctiva pink Ears- hearing intact Oropharynx- clear Neck- supple, no JVP Lymph- no cervical lymphadenopathy Lungs- Clear to ausculation bilaterally, normal work of breathing Heart- Regular rate and rhythm, no murmurs, rubs or gallops, PMI not laterally displaced GI- soft, NT, ND, + BS Extremities- no clubbing, cyanosis, or edema Skin- no rash or lesion Psych- euthymic mood, full affect Neuro- strength and sensation are intact  LABS: Basic Metabolic Panel:  Recent Labs  01/09/15 0147 01/09/15 0442  NA 130* 131*  K 4.0 3.8  CL 98* 98*  CO2 24 23  GLUCOSE 200* 139*  BUN 13 12  CREATININE 0.96 0.87  CALCIUM 9.2 9.1  MG 1.5* 1.4*  PHOS 3.2 3.9   Liver Function  Tests:  Recent Labs  01/09/15 0147 01/09/15 0442  AST 216* 175*  ALT 265* 249*  ALKPHOS 45 46  BILITOT 0.6 0.7  PROT 6.0* 6.2*  ALBUMIN 3.5 3.6   No results for input(s): LIPASE, AMYLASE in the last 72 hours. CBC:  Recent Labs  01/08/15 1239 01/09/15 0147 01/09/15 0442  WBC 15.6* 10.9* 13.3*  NEUTROABS 14.8*  --   --   HGB 14.2 13.8 14.2  HCT 39.3 38.6* 39.4  MCV 91.2 92.1 92.3  PLT 192 200 199   Cardiac Enzymes:  Recent Labs  01/08/15 1242 01/08/15 1949 01/09/15 0147  TROPONINI 0.24* 0.57* 0.52*   BNP: Invalid input(s): POCBNP D-Dimer: No results for input(s): DDIMER in the last 72 hours. Hemoglobin A1C: No results for input(s): HGBA1C in the last 72 hours. Fasting Lipid Panel: No results for input(s): CHOL, HDL, LDLCALC, TRIG, CHOLHDL, LDLDIRECT in the last 72 hours. Thyroid Function Tests: No results for input(s): TSH, T4TOTAL, T3FREE, THYROIDAB in the last 72 hours.  Invalid input(s): FREET3 Anemia Panel: No results for input(s): VITAMINB12, FOLATE, FERRITIN, TIBC, IRON, RETICCTPCT in the last 72 hours.  RADIOLOGY: Ct Soft Tissue Neck W Contrast  12/26/2014  CLINICAL DATA:  Right facial mass, long-standing but now causing pain. EXAM: CT NECK  WITH CONTRAST TECHNIQUE: Multidetector CT imaging of the neck was performed using the standard protocol following the bolus administration of intravenous contrast. CONTRAST:  117mL OMNIPAQUE IOHEXOL 300 MG/ML  SOLN COMPARISON:  None. FINDINGS: Pharynx and larynx: No asymmetric mass effect or enhancement. Salivary glands: Right neck mass is a ill-defined nearly 4 cm rounded mass in the right parotid tail with heterogeneous enhancement. Benign or malignant tumors can have this appearance. No regional adenopathy. No skullbase foraminal erosion or expansion. No second salivary gland mass seen. Thyroid: Negative Lymph nodes: No enlarged or necrotic appearing nodes Vascular: Carotid bifurcation atherosclerosis without flow  limiting stenosis. Limited intracranial: Negative Visualized orbits: Negative Mastoids and visualized paranasal sinuses: Patchy inflammatory mucosal thickening in the paranasal sinuses, greatest in the ethmoids. No acute sinusitis. Skeleton: Bulky spondylosis inferiorly in the cervical spine. No acute finding or aggressive process. Upper chest:  No apical pulmonary nodules. IMPRESSION: ~4 cm right parotid tail neoplasm.  Recommend ENT referral. Electronically Signed   By: Monte Fantasia M.D.   On: 12/26/2014 22:45   Dg Chest Port 1 View  01/08/2015  CLINICAL DATA:  Status post cardiopulmonary resuscitation. EXAM: PORTABLE CHEST 1 VIEW COMPARISON:  January 30, 2012. FINDINGS: The heart size and mediastinal contours are within normal limits. No pneumothorax or pleural effusion is noted. Left lung is clear. Mild right perihilar interstitial densities are noted which may represent edema or possibly inflammation. The visualized skeletal structures are unremarkable. IMPRESSION: Mild right perihilar interstitial densities are noted concerning for edema or inflammation. Electronically Signed   By: Marijo Conception, M.D.   On: 01/08/2015 13:41    ASSESSMENT AND PLAN:  Active Problems:   Parotid mass   Cardiac arrest (HCC)   Hypotension   Hyponatremia   Tobacco use disorder   Alcohol use (HCC)   Essential hypertension Had VF in the OR per the notes.  Unable to find the tracings to see exactly what happened and if there is a potential trigger such as a PVC prior to initiation of VF that may be ablated.  Tremon Sainvil try to find the strips and discuss with anesthesia what happened and if there is an indication for ICD.  Deserai Cansler Meredith Leeds, MD 01/10/2015 10:42 AM

## 2015-01-11 ENCOUNTER — Encounter (HOSPITAL_COMMUNITY): Payer: Self-pay | Admitting: Cardiovascular Disease

## 2015-01-11 ENCOUNTER — Inpatient Hospital Stay (HOSPITAL_COMMUNITY): Payer: BLUE CROSS/BLUE SHIELD

## 2015-01-11 DIAGNOSIS — I5189 Other ill-defined heart diseases: Secondary | ICD-10-CM

## 2015-01-11 DIAGNOSIS — I4901 Ventricular fibrillation: Secondary | ICD-10-CM | POA: Diagnosis not present

## 2015-01-11 DIAGNOSIS — I4510 Unspecified right bundle-branch block: Secondary | ICD-10-CM

## 2015-01-11 DIAGNOSIS — I2510 Atherosclerotic heart disease of native coronary artery without angina pectoris: Secondary | ICD-10-CM

## 2015-01-11 DIAGNOSIS — I519 Heart disease, unspecified: Secondary | ICD-10-CM

## 2015-01-11 MED ORDER — MAGNESIUM SULFATE 2 GM/50ML IV SOLN
2.0000 g | Freq: Once | INTRAVENOUS | Status: AC
  Administered 2015-01-11: 2 g via INTRAVENOUS
  Filled 2015-01-11 (×2): qty 50

## 2015-01-11 MED ORDER — GADOBENATE DIMEGLUMINE 529 MG/ML IV SOLN
30.0000 mL | Freq: Once | INTRAVENOUS | Status: AC
Start: 2015-01-11 — End: 2015-01-11
  Administered 2015-01-11: 28 mL via INTRAVENOUS

## 2015-01-11 NOTE — Consult Note (Signed)
SUBJECTIVE: The patient is doing well today.  At this time, he denies chest pain, shortness of breath, or any new concerns.  Echo shows no valvular heart disease.  Cath did show mild apical ballooning.  [name] Kitchen aspirin  81 mg Oral Daily  . atorvastatin  80 mg Oral q1800  . enoxaparin (LOVENOX) injection  40 mg Subcutaneous QHS  . metoprolol tartrate  12.5 mg Oral BID  . neomycin-bacitracin-polymyxin   Topical TID  . nicotine  21 mg Transdermal Daily  . pantoprazole  40 mg Oral QHS  . sodium chloride  3 mL Intravenous Q12H   . phenylephrine (NEO-SYNEPHRINE) Adult infusion Stopped (01/08/15 1400)    OBJECTIVE: Physical Exam: Filed Vitals:   01/10/15 1644 01/10/15 1953 01/11/15 0436 01/11/15 0950  BP: 138/81 134/86 110/70 115/72  Pulse: 67 78 67 75  Temp: 97.7 F (36.5 C) 97.7 F (36.5 C) 98 F (36.7 C)   TempSrc: Oral Oral Oral   Resp: 18 18 18    Height:      Weight:      SpO2: 100% 100% 97%     Intake/Output Summary (Last 24 hours) at 01/11/15 1030 Last data filed at 01/11/15 0730  Gross per 24 hour  Intake   1200 ml  Output      0 ml  Net   1200 ml    Telemetry reveals sinus rhythm  GEN- The patient is well appearing, alert and oriented x 3 today.   Head- normocephalic, atraumatic Eyes-  Sclera clear, conjunctiva pink Ears- hearing intact Oropharynx- clear Neck- supple, no JVP Lymph- no cervical lymphadenopathy Lungs- Clear to ausculation bilaterally, normal work of breathing Heart- Regular rate and rhythm, no murmurs, rubs or gallops, PMI not laterally displaced GI- soft, NT, ND, + BS Extremities- no clubbing, cyanosis, or edema Skin- no rash or lesion Psych- euthymic mood, full affect Neuro- strength and sensation are intact  LABS: Basic Metabolic Panel:  Recent Labs  01/09/15 0147 01/09/15 0442  NA 130* 131*  K 4.0 3.8  CL 98* 98*  CO2 24 23  GLUCOSE 200* 139*  BUN 13 12  CREATININE 0.96 0.87  CALCIUM 9.2 9.1  MG 1.5* 1.4*  PHOS 3.2 3.9    Liver Function Tests:  Recent Labs  01/09/15 0147 01/09/15 0442  AST 216* 175*  ALT 265* 249*  ALKPHOS 45 46  BILITOT 0.6 0.7  PROT 6.0* 6.2*  ALBUMIN 3.5 3.6   No results for input(s): LIPASE, AMYLASE in the last 72 hours. CBC:  Recent Labs  01/08/15 1239 01/09/15 0147 01/09/15 0442  WBC 15.6* 10.9* 13.3*  NEUTROABS 14.8*  --   --   HGB 14.2 13.8 14.2  HCT 39.3 38.6* 39.4  MCV 91.2 92.1 92.3  PLT 192 200 199   Cardiac Enzymes:  Recent Labs  01/08/15 1242 01/08/15 1949 01/09/15 0147  TROPONINI 0.24* 0.57* 0.52*   BNP: Invalid input(s): POCBNP D-Dimer: No results for input(s): DDIMER in the last 72 hours. Hemoglobin A1C: No results for input(s): HGBA1C in the last 72 hours. Fasting Lipid Panel: No results for input(s): CHOL, HDL, LDLCALC, TRIG, CHOLHDL, LDLDIRECT in the last 72 hours. Thyroid Function Tests: No results for input(s): TSH, T4TOTAL, T3FREE, THYROIDAB in the last 72 hours.  Invalid input(s): FREET3 Anemia Panel: No results for input(s): VITAMINB12, FOLATE, FERRITIN, TIBC, IRON, RETICCTPCT in the last 72 hours.  RADIOLOGY: Ct Soft Tissue Neck W Contrast  12/26/2014  CLINICAL DATA:  Right facial mass, long-standing but now causing  pain. EXAM: CT NECK WITH CONTRAST TECHNIQUE: Multidetector CT imaging of the neck was performed using the standard protocol following the bolus administration of intravenous contrast. CONTRAST:  151mL OMNIPAQUE IOHEXOL 300 MG/ML  SOLN COMPARISON:  None. FINDINGS: Pharynx and larynx: No asymmetric mass effect or enhancement. Salivary glands: Right neck mass is a ill-defined nearly 4 cm rounded mass in the right parotid tail with heterogeneous enhancement. Benign or malignant tumors can have this appearance. No regional adenopathy. No skullbase foraminal erosion or expansion. No second salivary gland mass seen. Thyroid: Negative Lymph nodes: No enlarged or necrotic appearing nodes Vascular: Carotid bifurcation  atherosclerosis without flow limiting stenosis. Limited intracranial: Negative Visualized orbits: Negative Mastoids and visualized paranasal sinuses: Patchy inflammatory mucosal thickening in the paranasal sinuses, greatest in the ethmoids. No acute sinusitis. Skeleton: Bulky spondylosis inferiorly in the cervical spine. No acute finding or aggressive process. Upper chest:  No apical pulmonary nodules. IMPRESSION: ~4 cm right parotid tail neoplasm.  Recommend ENT referral. Electronically Signed   By: Monte Fantasia M.D.   On: 12/26/2014 22:45   Dg Chest Port 1 View  01/08/2015  CLINICAL DATA:  Status post cardiopulmonary resuscitation. EXAM: PORTABLE CHEST 1 VIEW COMPARISON:  January 30, 2012. FINDINGS: The heart size and mediastinal contours are within normal limits. No pneumothorax or pleural effusion is noted. Left lung is clear. Mild right perihilar interstitial densities are noted which may represent edema or possibly inflammation. The visualized skeletal structures are unremarkable. IMPRESSION: Mild right perihilar interstitial densities are noted concerning for edema or inflammation. Electronically Signed   By: Marijo Conception, M.D.   On: 01/08/2015 13:41    ASSESSMENT AND PLAN:  Active Problems:   Parotid mass   Cardiac arrest (HCC)   Hypotension   Hyponatremia   Tobacco use disorder   Alcohol use (HCC)   Essential hypertension   Ventricular fibrillation (HCC)   CAD (coronary artery disease)   Mild left ventricular systolic dysfunction   Hypomagnesemia   Right bundle branch block Had VF in the OR per the notes.  Unable to find the tracings to see exactly what happened and if there is a potential trigger such as a PVC prior to initiation of VF that may be ablated.  Discussed options with the patient.  Ivery Michalski plan for CM[facility] to evaluate for any infiltrative diseases.  Offered life vest vs. ICD placement, the patient would prefer life vest for 3 months and follow up in clinic to determine  further management unless CMR is concerning.  Claus Silvestro Meredith Leeds, MD 01/11/2015 10:30 AM

## 2015-01-11 NOTE — Progress Notes (Signed)
Subjective: POD#3 from aborted right parotidectomy for parotid mass, had intraoperative Vfib requiring code. No residual neurological deficits. He is awake and alert, wondering when he can go home.   Objective: Vital signs in last 24 hours: Temp:  [97.7 F (36.5 C)-98.1 F (36.7 C)] 98 F (36.7 C) (10/17 0436) Pulse Rate:  [65-82] 67 (10/17 0436) Resp:  [15-23] 18 (10/17 0436) BP: (110-142)/(70-95) 110/70 mmHg (10/17 0436) SpO2:  [95 %-100 %] 97 % (10/17 0436)  Facial nerve House brackmann 1/6 bilaterally in all divisions. Right modified Blair incision C/D/i with absorbable gut sutures. Awake, alert, conversant  @LABLAST2 (wbc:2,hgb:2,hct:2,plt:2)  Recent Labs  01/09/15 0147 01/09/15 0442  NA 130* 131*  K 4.0 3.8  CL 98* 98*  CO2 24 23  GLUCOSE 200* 139*  BUN 13 12  CREATININE 0.96 0.87  CALCIUM 9.2 9.1    Medications:  Scheduled Meds: . aspirin  81 mg Oral Daily  . atorvastatin  80 mg Oral q1800  . enoxaparin (LOVENOX) injection  40 mg Subcutaneous QHS  . magnesium sulfate 1 - 4 g bolus IVPB  2 g Intravenous Once  . metoprolol tartrate  12.5 mg Oral BID  . neomycin-bacitracin-polymyxin   Topical TID  . nicotine  21 mg Transdermal Daily  . pantoprazole  40 mg Oral QHS  . sodium chloride  3 mL Intravenous Q12H   Continuous Infusions: . phenylephrine (NEO-SYNEPHRINE) Adult infusion Stopped (01/08/15 1400)   PRN Meds:.sodium chloride, acetaminophen, diazepam, HYDROcodone-acetaminophen, morphine injection, ondansetron **OR** ondansetron (ZOFRAN) IV, ondansetron (ZOFRAN) IV, sodium chloride  Assessment/Plan: Greatly appreciate cardiology and critical care assistance. Patient had some Takotsubo apical ballooning on cath and has some hypomagnesemia, unclear if this was the etiology of the Vfib intraoperatively. He is OK to go home from ENT and critical care standpoint, will await clearance from cardiology for discharge. I will set him up for referral to [facility] and  Neck surgery/Otolaryngology for excision of the parotid mass as an outpatient which will give the Takotsubo cardiomyopathy time to resolve.   LOS: 3 days   Ruby Cola 01/11/2015, 8:15 AM

## 2015-01-11 NOTE — Progress Notes (Signed)
Patient right arm IV infiltrated. Patient states burning at the site. Patient's right forearm swelling, warm to touch, and not tender. Ice pack applied to site for 20 minutes. Education given to patient. Will continue to monitor.   Domingo Dimes RN

## 2015-01-11 NOTE — Progress Notes (Signed)
SUBJECTIVE: Patient is stable and feeling well.   Filed Vitals:   01/10/15 1600 01/10/15 1644 01/10/15 1953 01/11/15 0436  BP:  138/81 134/86 110/70  Pulse: 65 67 78 67  Temp:  97.7 F (36.5 C) 97.7 F (36.5 C) 98 F (36.7 C)  TempSrc:  Oral Oral Oral  Resp: 17 18 18 18   Height:      Weight:      SpO2: 97% 100% 100% 97%     Intake/Output Summary (Last 24 hours) at 01/11/15 0659 Last data filed at 01/10/15 1800  Gross per 24 hour  Intake   1200 ml  Output      0 ml  Net   1200 ml    LABS: Basic Metabolic Panel:  Recent Labs  01/09/15 0147 01/09/15 0442  NA 130* 131*  K 4.0 3.8  CL 98* 98*  CO2 24 23  GLUCOSE 200* 139*  BUN 13 12  CREATININE 0.96 0.87  CALCIUM 9.2 9.1  MG 1.5* 1.4*  PHOS 3.2 3.9   Liver Function Tests:  Recent Labs  01/09/15 0147 01/09/15 0442  AST 216* 175*  ALT 265* 249*  ALKPHOS 45 46  BILITOT 0.6 0.7  PROT 6.0* 6.2*  ALBUMIN 3.5 3.6   No results for input(s): LIPASE, AMYLASE in the last 72 hours. CBC:  Recent Labs  01/08/15 1239 01/09/15 0147 01/09/15 0442  WBC 15.6* 10.9* 13.3*  NEUTROABS 14.8*  --   --   HGB 14.2 13.8 14.2  HCT 39.3 38.6* 39.4  MCV 91.2 92.1 92.3  PLT 192 200 199   Cardiac Enzymes:  Recent Labs  01/08/15 1242 01/08/15 1949 01/09/15 0147  TROPONINI 0.24* 0.57* 0.52*   BNP: Invalid input(s): POCBNP D-Dimer: No results for input(s): DDIMER in the last 72 hours. Hemoglobin A1C: No results for input(s): HGBA1C in the last 72 hours. Fasting Lipid Panel: No results for input(s): CHOL, HDL, LDLCALC, TRIG, CHOLHDL, LDLDIRECT in the last 72 hours. Thyroid Function Tests: No results for input(s): TSH, T4TOTAL, T3FREE, THYROIDAB in the last 72 hours.  Invalid input(s): FREET3  RADIOLOGY: Ct Soft Tissue Neck W Contrast  12/26/2014  CLINICAL DATA:  Right facial mass, long-standing but now causing pain. EXAM: CT NECK WITH CONTRAST TECHNIQUE: Multidetector CT imaging of the neck was performed  using the standard protocol following the bolus administration of intravenous contrast. CONTRAST:  171mL OMNIPAQUE IOHEXOL 300 MG/ML  SOLN COMPARISON:  None. FINDINGS: Pharynx and larynx: No asymmetric mass effect or enhancement. Salivary glands: Right neck mass is a ill-defined nearly 4 cm rounded mass in the right parotid tail with heterogeneous enhancement. Benign or malignant tumors can have this appearance. No regional adenopathy. No skullbase foraminal erosion or expansion. No second salivary gland mass seen. Thyroid: Negative Lymph nodes: No enlarged or necrotic appearing nodes Vascular: Carotid bifurcation atherosclerosis without flow limiting stenosis. Limited intracranial: Negative Visualized orbits: Negative Mastoids and visualized paranasal sinuses: Patchy inflammatory mucosal thickening in the paranasal sinuses, greatest in the ethmoids. No acute sinusitis. Skeleton: Bulky spondylosis inferiorly in the cervical spine. No acute finding or aggressive process. Upper chest:  No apical pulmonary nodules. IMPRESSION: ~4 cm right parotid tail neoplasm.  Recommend ENT referral. Electronically Signed   By: Monte Fantasia M.D.   On: 12/26/2014 22:45   Dg Chest Port 1 View  01/08/2015  CLINICAL DATA:  Status post cardiopulmonary resuscitation. EXAM: PORTABLE CHEST 1 VIEW COMPARISON:  January 30, 2012. FINDINGS: The heart size and mediastinal contours are within  normal limits. No pneumothorax or pleural effusion is noted. Left lung is clear. Mild right perihilar interstitial densities are noted which may represent edema or possibly inflammation. The visualized skeletal structures are unremarkable. IMPRESSION: Mild right perihilar interstitial densities are noted concerning for edema or inflammation. Electronically Signed   By: Marijo Conception, M.D.   On: 01/08/2015 13:41    PHYSICAL EXAM   patient is resting in bed. His wife is in the room. He is oriented to person time and place. Affect is normal. Lungs  are clear. Respiratory effort is nonlabored. Cardiac exam reveals S1 and S2. The abdomen is soft. There is no peripheral edema.   TELEMETRY: I have reviewed telemetry today. There is normal sinus rhythm.   ASSESSMENT AND PLAN:    Parotid mass    He was to have surgery for this but had ventricular fibrillation with resuscitation in the operating room.    Cardiac arrest [facility])     It is my understanding that the patient had ventricular fibrillation. He is being seen by electrophysiology. At this point the evaluation is not complete because the rhythm strips have not been available. His magnesium continues to be low. In addition, catheterization revealed ballooning of the apex when the left ventriculogram was done on October 14. The overall ejection fraction was normal at that time. The echo on January 09, 2015 revealed normal ejection fraction with no mention of wall motion abnormality. This echo can be reviewed to see if there is any residual apical wall motion abnormality at that time. The other possibility is that this apical ballooning improved over the 24-hour period. Consideration has to be given as to whether the patient had some type of Takotsubo event during his surgery. In addition his magnesium needs to be treated and further assessment to see if he has chronic hypomagnesemia.     Ventricular fibrillation ([place])      I have outlined the approach to this above under cardiac arrest.    CAD (coronary artery disease)     Catheterization this admission revealed only minimal coronary disease. It seems unlikely that this is the basis of his cardiac arrest.    Mild left ventricular systolic dysfunction     In the cath lab the patient had mild apical ballooning noted on his left ventriculogram. It appears that this wall motion abnormality was not present on the echo that was done the next day. It is possible that he had some type of Takotsubo event on the day of surgery..   Right bundle-branch  block    The patient has chronic right bundle branch block    Hypomagnesemia      Etiology of hypomagnesemia remains unclear. I will be giving him IV magnesium today. He will be repeated tomorrow. This may need further evaluation.   Dola Argyle 01/11/2015 6:59 AM

## 2015-01-12 ENCOUNTER — Encounter (HOSPITAL_COMMUNITY)
Admission: AD | Disposition: A | Discharge status: Home / Self Care | Payer: Self-pay | Source: Ambulatory Visit | Attending: Otolaryngology

## 2015-01-12 DIAGNOSIS — I4901 Ventricular fibrillation: Secondary | ICD-10-CM

## 2015-01-12 HISTORY — PX: EP IMPLANTABLE DEVICE: SHX172B

## 2015-01-12 LAB — MAGNESIUM: MAGNESIUM: 1.8 mg/dL (ref 1.7–2.4)

## 2015-01-12 SURGERY — ICD IMPLANT

## 2015-01-12 MED ORDER — CEFAZOLIN SODIUM-DEXTROSE 2-3 GM-% IV SOLR
INTRAVENOUS | Status: DC | PRN
  Administered 2015-01-12: 2 g via INTRAVENOUS

## 2015-01-12 MED ORDER — IOHEXOL 350 MG/ML SOLN
INTRAVENOUS | Status: DC | PRN
  Administered 2015-01-12: 15 mL via INTRAVENOUS

## 2015-01-12 MED ORDER — SODIUM CHLORIDE 0.9 % IR SOLN
Status: AC
  Filled 2015-01-12: qty 2

## 2015-01-12 MED ORDER — MIDAZOLAM HCL 5 MG/5ML IJ SOLN
INTRAMUSCULAR | Status: AC
  Filled 2015-01-12: qty 25

## 2015-01-12 MED ORDER — SODIUM CHLORIDE 0.9 % IV SOLN: 250.0000 mL | INTRAVENOUS | Status: DC | PRN

## 2015-01-12 MED ORDER — HEPARIN (PORCINE) IN NACL 2-0.9 UNIT/ML-% IJ SOLN
INTRAMUSCULAR | Status: DC | PRN
  Administered 2015-01-12: 500 mL

## 2015-01-12 MED ORDER — SODIUM CHLORIDE 0.9 % IJ SOLN
3.0000 mL | Freq: Two times a day (BID) | INTRAMUSCULAR | Status: DC
  Administered 2015-01-13: 3 mL via INTRAVENOUS

## 2015-01-12 MED ORDER — SODIUM CHLORIDE 0.9 % IJ SOLN: 3.0000 mL | INTRAMUSCULAR | Status: DC | PRN

## 2015-01-12 MED ORDER — SODIUM CHLORIDE 0.9 % IV SOLN: INTRAVENOUS | Status: DC

## 2015-01-12 MED ORDER — LIDOCAINE HCL (PF) 1 % IJ SOLN
INTRAMUSCULAR | Status: DC | PRN
  Administered 2015-01-12: 60 mL

## 2015-01-12 MED ORDER — ACETAMINOPHEN 325 MG PO TABS: 325.0000 mg | ORAL_TABLET | ORAL | Status: DC | PRN

## 2015-01-12 MED ORDER — ONDANSETRON HCL 4 MG/2ML IJ SOLN: 4.0000 mg | Freq: Four times a day (QID) | INTRAMUSCULAR | Status: DC | PRN

## 2015-01-12 MED ORDER — SODIUM CHLORIDE 0.9 % IR SOLN
80.0000 mg | Status: DC
  Filled 2015-01-12: qty 2

## 2015-01-12 MED ORDER — MIDAZOLAM HCL 5 MG/5ML IJ SOLN
INTRAMUSCULAR | Status: DC | PRN
  Administered 2015-01-12: 2 mg via INTRAVENOUS
  Administered 2015-01-12: 1 mg via INTRAVENOUS
  Administered 2015-01-12: 2 mg via INTRAVENOUS
  Administered 2015-01-12: 1 mg via INTRAVENOUS
  Administered 2015-01-12 (×2): 2 mg via INTRAVENOUS

## 2015-01-12 MED ORDER — CEFAZOLIN SODIUM 1-5 GM-% IV SOLN
1.0000 g | Freq: Four times a day (QID) | INTRAVENOUS | Status: AC
  Administered 2015-01-12 – 2015-01-13 (×3): 1 g via INTRAVENOUS
  Filled 2015-01-12 (×3): qty 50

## 2015-01-12 MED ORDER — FENTANYL CITRATE (PF) 100 MCG/2ML IJ SOLN
INTRAMUSCULAR | Status: DC | PRN
  Administered 2015-01-12 (×3): 25 ug via INTRAVENOUS

## 2015-01-12 MED ORDER — CEFAZOLIN SODIUM-DEXTROSE 2-3 GM-% IV SOLR: 2.0000 g | INTRAVENOUS | Status: DC

## 2015-01-12 MED ORDER — CEFAZOLIN SODIUM-DEXTROSE 2-3 GM-% IV SOLR
INTRAVENOUS | Status: AC
  Filled 2015-01-12: qty 50

## 2015-01-12 MED ORDER — FENTANYL CITRATE (PF) 100 MCG/2ML IJ SOLN
INTRAMUSCULAR | Status: AC
  Filled 2015-01-12: qty 4

## 2015-01-12 MED ORDER — SODIUM CHLORIDE 0.9 % IR SOLN
Status: DC | PRN
  Administered 2015-01-12: 16:00:00

## 2015-01-12 MED ORDER — SODIUM CHLORIDE 0.45 % IV SOLN: INTRAVENOUS | Status: DC

## 2015-01-12 MED ORDER — LIDOCAINE HCL (PF) 1 % IJ SOLN
INTRAMUSCULAR | Status: AC
  Filled 2015-01-12: qty 60

## 2015-01-12 MED ORDER — HYDROCODONE-ACETAMINOPHEN 5-325 MG PO TABS
1.0000 | ORAL_TABLET | ORAL | Status: DC | PRN
  Administered 2015-01-12: 1 via ORAL
  Administered 2015-01-13: 2 via ORAL
  Filled 2015-01-12 (×2): qty 2

## 2015-01-12 SURGICAL SUPPLY — 7 items
CABLE SURGICAL S-101-97-12 (CABLE) ×2 IMPLANT
ICD VISIA MRI VR DVFB1D4 (ICD Generator) ×1 IMPLANT
LEAD SPRINT QUAT SEC 6935M-62 (Lead) ×2 IMPLANT
PAD DEFIB LIFELINK (PAD) ×2 IMPLANT
SHEATH CLASSIC 9F (SHEATH) ×2 IMPLANT
TRAY PACEMAKER INSERTION (PACKS) ×2 IMPLANT
VISIA MRI VR DVFB1D4 (ICD Generator) ×2 IMPLANT

## 2015-01-12 NOTE — Progress Notes (Signed)
Orthopedic Tech Progress Note Patient Details:  Stephen Hays [birth year] [id number] Patient has arm sling on. Patient ID: Stephen Hays, male   DOB: 11-25-1965, 49 y.o.   MRN: [id number]   Stephen Hays 01/12/2015, 10:42 PM

## 2015-01-12 NOTE — Progress Notes (Signed)
Subjective: POD#4 from aborted right parotidectomy with Vfib arrest requiring defibrillation x 3. Cardiac MRI yesterday showed right ventricular dysplasia, patient states he is set up for ICD today.  Objective: Vital signs in last 24 hours: Temp:  [97.9 F (36.6 C)-98 F (36.7 C)] 97.9 F (36.6 C) (10/18 0454) Pulse Rate:  [62-89] 62 (10/18 0454) Resp:  [17-18] 17 (10/18 0454) BP: (114-150)/(67-87) 114/67 mmHg (10/18 0454) SpO2:  [98 %-100 %] 98 % (10/18 0454)  Right modified blair incision c/d/i with absorbable gut sutures. Facial nerve intact, right parotid mass stable  @LABLAST2 (wbc:2,hgb:2,hct:2,plt:2) No results for input(s): NA, K, CL, CO2, GLUCOSE, BUN, CREATININE, CALCIUM in the last 72 hours.  Medications:  Scheduled Meds: . aspirin  81 mg Oral Daily  . atorvastatin  80 mg Oral q1800  . enoxaparin (LOVENOX) injection  40 mg Subcutaneous QHS  . metoprolol tartrate  12.5 mg Oral BID  . neomycin-bacitracin-polymyxin   Topical TID  . nicotine  21 mg Transdermal Daily  . pantoprazole  40 mg Oral QHS  . sodium chloride  3 mL Intravenous Q12H   Continuous Infusions:  PRN Meds:.sodium chloride, acetaminophen, diazepam, HYDROcodone-acetaminophen, morphine injection, ondansetron **OR** ondansetron (ZOFRAN) IV, ondansetron (ZOFRAN) IV, sodium chloride  Assessment/Plan: Stable POD#4 from aborted right parotidectomy. Having ICD implanted today, I will defer to cardiology on post-op plans and discharge after ICD. I will set patient up for outpatient referral to [facility] for parotidectomy once he is cleared by cardiology. Appreciate cardiology assistance.   LOS: 4 days   Ruby Cola 01/12/2015, 8:47 AM

## 2015-01-12 NOTE — Progress Notes (Signed)
Utilization review completed.  

## 2015-01-12 NOTE — Progress Notes (Signed)
Please refer to my complete note from yesterday. The patient is being followed carefully by Dr. Curt Bears of the electrophysiology team. He has ordered a cardiac MRI. The remaining cardiac issue is the electrophysiology decision concerning the long term approach to his cardiac arrest. Nothing to add today from my viewpoint.  Stephen November, MD

## 2015-01-12 NOTE — Interval H&P Note (Signed)
History and Physical Interval Note:  01/12/2015 1:47 PM  The patient examined, chart reviewed, and case discussed with both Drs Curt Bears and Simeon Craft.  Pt with parotid mass.  Per my discussions with Dr Simeon Craft, this is felt to be unlikely malignant and his exepected mortality is > 1 year.  He is not felt to require near future MRIs or radiation.   Given documented VF arrest requiring defibrillation and structural heart disease (AVRD by MRI), I agree with Dr Curt Bears that the patient requires ICD implantation for secondary prevention of sudden death.  ICD Criteria  Current LVEF:65%. Within 12 months prior to implant: Yes   Heart failure history: Yes, Class II  Cardiomyopathy history: No.  Atrial Fibrillation/Atrial Flutter: No.  Ventricular tachycardia history: Yes, Hemodynamic instability present. VT Type: Sustained Ventricular Tachycardia - Polymorphic.  Cardiac arrest history: Yes, Ventricular Fibrillation.  History of syndromes with risk of sudden death: Yes, Other  Arrhythmogenic right ventricular dysplasia  Previous ICD: No.  Current ICD indication: Secondary  PPM indication: No.   Class I or II Bradycardia indication present: No  Beta Blocker therapy for 3 or more months: not indicated, preserved EF  Ace Inhibitor/ARB therapy for 3 or more months: not indicated, preserved EF   Stephen Hays  has presented today for surgery, with the diagnosis of arvd  The various methods of treatment have been discussed with the patient and family. After consideration of risks, benefits and other options for treatment, the patient has consented to  Procedure(s): ICD Implant (N/A) as a surgical intervention .  The patient's history has been reviewed, patient examined, no change in status, stable for surgery.  I have reviewed the patient's chart and labs.  Questions were answered to the patient's satisfaction.     Thompson Grayer

## 2015-01-12 NOTE — Progress Notes (Signed)
SUBJECTIVE: The patient is doing well today.  At this time, he has minimal chest wall discomfort (post defibrillation) only, no shortness of breath, or any new concerns.  [name] Kitchen aspirin  81 mg Oral Daily  . atorvastatin  80 mg Oral q1800  . enoxaparin (LOVENOX) injection  40 mg Subcutaneous QHS  . metoprolol tartrate  12.5 mg Oral BID  . neomycin-bacitracin-polymyxin   Topical TID  . nicotine  21 mg Transdermal Daily  . pantoprazole  40 mg Oral QHS  . sodium chloride  3 mL Intravenous Q12H      OBJECTIVE: Physical Exam: Filed Vitals:   01/11/15 0950 01/11/15 1600 01/11/15 2029 01/12/15 0454  BP: 115/72 138/87 150/86 114/67  Pulse: 75 73 89 62  Temp:  98 F (36.7 C) 97.9 F (36.6 C) 97.9 F (36.6 C)  TempSrc:  Oral Oral Oral  Resp:  18 18 17   Height:      Weight:      SpO2:  99% 100% 98%    Intake/Output Summary (Last 24 hours) at 01/12/15 0820 Last data filed at 01/11/15 2325  Gross per 24 hour  Intake    603 ml  Output      0 ml  Net    603 ml    Telemetry reveals sinus rhythm  GEN- The patient is well appearing, alert and oriented x 3 today.   Head- normocephalic, atraumatic Eyes-  Sclera clear, conjunctiva pink Ears- hearing intact Neck- supple, no JVP Lungs- Clear to ausculation bilaterally, normal work of breathing Heart- Regular rate and rhythm, no murmurs, rubs or gallops, PMI not laterally displaced GI- soft, NT, ND, + BS Extremities- no clubbing, cyanosis, or edema Skin- no rash or lesion Psych- euthymic mood, full affect Neuro- no gross deficits  LABS: Basic Metabolic Panel:  Recent Labs  01/12/15  MG 1.8   RADIOLOGY:  Dg Chest Port 1 View 01/08/2015  CLINICAL DATA:  Status post cardiopulmonary resuscitation. EXAM: PORTABLE CHEST 1 VIEW COMPARISON:  January 30, 2012. FINDINGS: The heart size and mediastinal contours are within normal limits. No pneumothorax or pleural effusion is noted. Left lung is clear. Mild right perihilar interstitial  densities are noted which may represent edema or possibly inflammation. The visualized skeletal structures are unremarkable. IMPRESSION: Mild right perihilar interstitial densities are noted concerning for edema or inflammation. Electronically Signed   By: Marijo Conception, M.D.   On: 01/08/2015 13:41   Mr Card Morphology Wo/w Cm 01/11/2015  CLINICAL DATA:  49 year old male with EXAM: CARDIAC MRI TECHNIQUE: The patient was scanned on a 1.5 Tesla GE magnet. A dedicated cardiac coil was used. Functional imaging was done using Fiesta sequences. 2,3, and 4 chamber views were done to assess for RWMA's. Modified Simpson's rule using a short axis stack was used to calculate an ejection fraction on a dedicated work Conservation officer, nature. The patient received 24 cc of Multihance. After 10 minutes inversion recovery sequences were used to assess for infiltration and scar tissue. CONTRAST:  24 cc of Multihance FINDINGS: 1. Normal left ventricular size with mild concentric hypertrophy and normal systolic function (LVEF = 56%) with no regional wall motion abnormalities. LVEDD:  44 mm LVESD:  35 mm LVEDV:  125 ml LVESV:  55 ml SV:  70 ml CO:  5.5 L/min Myocardial mass:  158 g 2. Normal right ventricular size, thickness and midly impaired systolic function (RVEF = 44%). There is dyskinesis of the RV free wall at the superior attachment to  the LV wall. RVEDV:  160 ml RVESV:  89 ml SV:  70 ml CO:  5.5 L.minute 3.  Normal biatrial size. 4.  Trivial mitral and mild tricuspid regurgitation. 5. Normal size of the aortic root, ascending aorta and pulmonary artery. 6. No late gadolinium enhancement is seen in the myocardium of the left ventricle. There is late gadolinium enhancement in the RV free wall at the superior attachment to the LV wall. IMPRESSION: 1. Normal left ventricular size with mild concentric hypertrophy and normal systolic function (LVEF = 56%) with no regional wall motion abnormalities. There is dyskinesis (a  small aneurysm) of the RV free wall at the superior attachment to the LV wall associated with late gadolinium enhancement. This is suspicious for an arrhythmogenic right ventricular dysplasia. 2. Normal right ventricular size, thickness and mildly impaired systolic function (RVEF = 44%). 3.  Trivial mitral and mild tricuspid regurgitation. [name] Electronically Signed   By: [name]   On:   01/09/15: Echocardiogram Study Conclusions - Left ventricle: The cavity size was normal. Wall thickness was normal. Systolic function was vigorous. The estimated ejection fraction was in the range of 65% to 70%. Doppler parameters are consistent with abnormal left ventricular relaxation (grade 1 diastolic dysfunction).  01/08/15: LHC  Prox LAD lesion, 30% stenosed.  Mid LAD lesion, 15% stenosed.  The left ventricular systolic function is normal.     ASSESSMENT AND PLAN:  The patient had VF in the OR per the notes. 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  Discussed with the patient and his wife indication of an ICD implant, risks/benifits of the procedure by Dr. Curt Bears and he is agreeable to proceed.  We Marleny Faller plan for today.  Also discussed getting him and his daughter genetically tested, to follow up out patient regarding this.  Active Problems:   Parotid mass   Cardiac arrest (HCC)   Hypotension   Hyponatremia   Tobacco use disorder   Alcohol use (HCC)   Essential hypertension   Ventricular fibrillation (HCC)   CAD (coronary artery disease)   Mild left ventricular systolic dysfunction   Hypomagnesemia   Right bundle branch block   [name], PA-C 01/12/2015 8:20 AM   I have seen and examined this patient with Tommye Standard.  Agree with above, note added to reflect my findings.  On exam, regular rhythm, no murmurs.  CMRI shows evidence of ARVC which could be a cause of his VF in the operating room.  Jazmynn Pho plan for ICD placement  today.  Have discussed the procedure with the patient and his wife including risks and benefits, including bleeding, infection, tamponade and pneumothorax.  They have agreed.  As an outpatient, he Aybree Lanyon need to have genetic testing for ARVC.    Joanthan Hlavacek M. Aalia Greulich MD 01/12/2015 9:40 AM

## 2015-01-12 NOTE — H&P (View-Only) (Signed)
SUBJECTIVE: The patient is doing well today.  At this time, he has minimal chest wall discomfort (post defibrillation) only, no shortness of breath, or any new concerns.  [name] Kitchen aspirin  81 mg Oral Daily  . atorvastatin  80 mg Oral q1800  . enoxaparin (LOVENOX) injection  40 mg Subcutaneous QHS  . metoprolol tartrate  12.5 mg Oral BID  . neomycin-bacitracin-polymyxin   Topical TID  . nicotine  21 mg Transdermal Daily  . pantoprazole  40 mg Oral QHS  . sodium chloride  3 mL Intravenous Q12H      OBJECTIVE: Physical Exam: Filed Vitals:   01/11/15 0950 01/11/15 1600 01/11/15 2029 01/12/15 0454  BP: 115/72 138/87 150/86 114/67  Pulse: 75 73 89 62  Temp:  98 F (36.7 C) 97.9 F (36.6 C) 97.9 F (36.6 C)  TempSrc:  Oral Oral Oral  Resp:  18 18 17   Height:      Weight:      SpO2:  99% 100% 98%    Intake/Output Summary (Last 24 hours) at 01/12/15 0820 Last data filed at 01/11/15 2325  Gross per 24 hour  Intake    603 ml  Output      0 ml  Net    603 ml    Telemetry reveals sinus rhythm  GEN- The patient is well appearing, alert and oriented x 3 today.   Head- normocephalic, atraumatic Eyes-  Sclera clear, conjunctiva pink Ears- hearing intact Neck- supple, no JVP Lungs- Clear to ausculation bilaterally, normal work of breathing Heart- Regular rate and rhythm, no murmurs, rubs or gallops, PMI not laterally displaced GI- soft, NT, ND, + BS Extremities- no clubbing, cyanosis, or edema Skin- no rash or lesion Psych- euthymic mood, full affect Neuro- no gross deficits  LABS: Basic Metabolic Panel:  Recent Labs  01/12/15  MG 1.8   RADIOLOGY:  Dg Chest Port 1 View 01/08/2015  CLINICAL DATA:  Status post cardiopulmonary resuscitation. EXAM: PORTABLE CHEST 1 VIEW COMPARISON:  January 30, 2012. FINDINGS: The heart size and mediastinal contours are within normal limits. No pneumothorax or pleural effusion is noted. Left lung is clear. Mild right perihilar interstitial  densities are noted which may represent edema or possibly inflammation. The visualized skeletal structures are unremarkable. IMPRESSION: Mild right perihilar interstitial densities are noted concerning for edema or inflammation. Electronically Signed   By: Stephen Hays, M.D.   On: 01/08/2015 13:41   Mr Card Morphology Wo/w Cm 01/11/2015  CLINICAL DATA:  49 year old male with EXAM: CARDIAC MRI TECHNIQUE: The patient was scanned on a 1.5 Tesla GE magnet. A dedicated cardiac coil was used. Functional imaging was done using Fiesta sequences. 2,3, and 4 chamber views were done to assess for RWMA's. Modified Simpson's rule using a short axis stack was used to calculate an ejection fraction on a dedicated work Conservation officer, nature. The patient received 24 cc of Multihance. After 10 minutes inversion recovery sequences were used to assess for infiltration and scar tissue. CONTRAST:  24 cc of Multihance FINDINGS: 1. Normal left ventricular size with mild concentric hypertrophy and normal systolic function (LVEF = 56%) with no regional wall motion abnormalities. LVEDD:  44 mm LVESD:  35 mm LVEDV:  125 ml LVESV:  55 ml SV:  70 ml CO:  5.5 L/min Myocardial mass:  158 g 2. Normal right ventricular size, thickness and midly impaired systolic function (RVEF = 44%). There is dyskinesis of the RV free wall at the superior attachment to  the LV wall. RVEDV:  160 ml RVESV:  89 ml SV:  70 ml CO:  5.5 L.minute 3.  Normal biatrial size. 4.  Trivial mitral and mild tricuspid regurgitation. 5. Normal size of the aortic root, ascending aorta and pulmonary artery. 6. No late gadolinium enhancement is seen in the myocardium of the left ventricle. There is late gadolinium enhancement in the RV free wall at the superior attachment to the LV wall. IMPRESSION: 1. Normal left ventricular size with mild concentric hypertrophy and normal systolic function (LVEF = 56%) with no regional wall motion abnormalities. There is dyskinesis (a  small aneurysm) of the RV free wall at the superior attachment to the LV wall associated with late gadolinium enhancement. This is suspicious for an arrhythmogenic right ventricular dysplasia. 2. Normal right ventricular size, thickness and mildly impaired systolic function (RVEF = 44%). 3.  Trivial mitral and mild tricuspid regurgitation. Stephen Hays Electronically Signed   By: Stephen Hays   On:   01/09/15: Echocardiogram Study Conclusions - Left ventricle: The cavity size was normal. Wall thickness was normal. Systolic function was vigorous. The estimated ejection fraction was in the range of 65% to 70%. Doppler parameters are consistent with abnormal left ventricular relaxation (grade 1 diastolic dysfunction).  01/08/15: LHC  Prox LAD lesion, 30% stenosed.  Mid LAD lesion, 15% stenosed.  The left ventricular systolic function is normal.     ASSESSMENT AND PLAN:  The patient had VF in the OR per the notes. 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  Discussed with the patient and his wife indication of an ICD implant, risks/benifits of the procedure by Dr. Curt Hays and he is agreeable to proceed.  We Stephen Hays plan for today.  Also discussed getting him and his daughter genetically tested, to follow up out patient regarding this.  Active Problems:   Parotid mass   Cardiac arrest (HCC)   Hypotension   Hyponatremia   Tobacco use disorder   Alcohol use (HCC)   Essential hypertension   Ventricular fibrillation (HCC)   CAD (coronary artery disease)   Mild left ventricular systolic dysfunction   Hypomagnesemia   Right bundle branch block   Stephen Dyane Dustman, PA-C 01/12/2015 8:20 AM   I have seen and examined this patient with Stephen Hays.  Agree with above, note added to reflect my findings.  On exam, regular rhythm, no murmurs.  CMRI shows evidence of ARVC which could be a cause of his VF in the operating room.  Stephen Hays plan for ICD placement  today.  Have discussed the procedure with the patient and his wife including risks and benefits, including bleeding, infection, tamponade and pneumothorax.  They have agreed.  As an outpatient, he Stephen Hays need to have genetic testing for ARVC.    Jlyn Cerros M. Wenda Vanschaick MD 01/12/2015 9:40 AM

## 2015-01-13 ENCOUNTER — Encounter: Payer: Self-pay | Admitting: *Deleted

## 2015-01-13 ENCOUNTER — Telehealth: Payer: Self-pay | Admitting: Internal Medicine

## 2015-01-13 ENCOUNTER — Inpatient Hospital Stay (HOSPITAL_COMMUNITY): Payer: BLUE CROSS/BLUE SHIELD

## 2015-01-13 ENCOUNTER — Encounter (HOSPITAL_COMMUNITY): Payer: Self-pay | Admitting: Internal Medicine

## 2015-01-13 LAB — BASIC METABOLIC PANEL
ANION GAP: 9 (ref 5–15)
BUN: 10 mg/dL (ref 6–20)
CALCIUM: 9.2 mg/dL (ref 8.9–10.3)
CHLORIDE: 99 mmol/L — AB (ref 101–111)
CO2: 26 mmol/L (ref 22–32)
Creatinine, Ser: 0.79 mg/dL (ref 0.61–1.24)
GFR calc non Af Amer: >60 mL/min (ref >60)
Glucose, Bld: 95 mg/dL (ref 65–99)
Potassium: 4 mmol/L (ref 3.5–5.1)
Sodium: 134 mmol/L — ABNORMAL LOW (ref 135–145)

## 2015-01-13 LAB — MAGNESIUM: MAGNESIUM: 1.9 mg/dL (ref 1.7–2.4)

## 2015-01-13 MED ORDER — BACITRACIN-NEOMYCIN-POLYMYXIN OINTMENT TUBE: 1.0000 "application " | TOPICAL_OINTMENT | Freq: Three times a day (TID) | CUTANEOUS | Status: DC

## 2015-01-13 MED ORDER — ATORVASTATIN CALCIUM 80 MG PO TABS: 80.0000 mg | ORAL_TABLET | Freq: Every day | ORAL | Status: DC

## 2015-01-13 MED ORDER — HYDROCODONE-ACETAMINOPHEN 5-325 MG PO TABS: 1.0000 | ORAL_TABLET | ORAL | Status: DC | PRN

## 2015-01-13 NOTE — Discharge Instructions (Signed)
° ° °  Supplemental Discharge Instructions for  Pacemaker/Defibrillator Patients  Activity No heavy lifting or vigorous activity with your left/right arm for 6 to 8 weeks.  Do not raise your left/right arm above your head for one week.  Gradually raise your affected arm as drawn below.           __     01/16/15                01/17/15                  01/18/15                  01/19/15  NO DRIVING for 6 months     WOUND CARE - Keep the wound area clean and dry.  Do not get this area wet for one week. No showers for one week; you may shower on  01/19/15   . - The tape/steri-strips on your wound will fall off; do not pull them off.  No bandage is needed on the site.  DO  NOT apply any creams, oils, or ointments to the wound area. - If you notice any drainage or discharge from the wound, any swelling or bruising at the site, or you develop a fever > 101? F after you are discharged home, call the office at once.  Special Instructions - You are still able to use cellular telephones; use the ear opposite the side where you have your pacemaker/defibrillator.  Avoid carrying your cellular phone near your device. - When traveling through airports, show security personnel your identification card to avoid being screened in the metal detectors.  Ask the security personnel to use the hand wand. - Avoid arc welding equipment, MRI testing (magnetic resonance imaging), TENS units (transcutaneous nerve stimulators).  Call the office for questions about other devices. - Avoid electrical appliances that are in poor condition or are not properly grounded. - Microwave ovens are safe to be near or to operate.  Additional information for defibrillator patients should your device go off: - If your device goes off ONCE and you feel fine afterward, notify the device clinic nurses. - If your device goes off ONCE and you do not feel well afterward, call 911. - If your device goes off TWICE, call 911. - If your  device goes off THREE times in one day, call 911.  DO NOT DRIVE YOURSELF OR A FAMILY MEMBER WITH A DEFIBRILLATOR TO THE HOSPITAL--CALL 911.   Discharge Instructions: follow up with [facility] cardiology/electrophysiology as scheduled. Dr. Theressa Millard office will arrange outpatient referral to [facility] ENT for removal of right parotid mass, facial sutures are absorbable, use OTC neosporin on incision TID and clean with soap and water ad lib.patient's family has hydrocodone Rx.

## 2015-01-13 NOTE — Progress Notes (Signed)
SUBJECTIVE: The patient is doing well today.  At this time, he denies chest pain, shortness of breath, or any new concerns.  CURRENT MEDICATIONS: . atorvastatin  80 mg Oral q1800  . neomycin-bacitracin-polymyxin   Topical TID  . nicotine  21 mg Transdermal Daily  . pantoprazole  40 mg Oral QHS  . sodium chloride  3 mL Intravenous Q12H   . sodium chloride    . sodium chloride    . sodium chloride      OBJECTIVE: Physical Exam: Filed Vitals:   01/12/15 1910 01/12/15 2110 01/12/15 2251 01/13/15 0513  BP: 157/99 145/91 139/86 124/82  Pulse: 102 91 79 76  Temp:  98.5 F (36.9 C)  98.5 F (36.9 C)  TempSrc:  Oral  Oral  Resp:  18  18  Height:      Weight:      SpO2:  99%  98%    Intake/Output Summary (Last 24 hours) at 01/13/15 5956 Last data filed at 01/12/15 2100  Gross per 24 hour  Intake    720 ml  Output      0 ml  Net    720 ml    Telemetry reveals sinus rhythm  GEN- The patient is well appearing, alert and oriented x 3 today.   Head- normocephalic, atraumatic Eyes-  Sclera clear, conjunctiva pink Ears- hearing intact Oropharynx- clear Neck- supple  Lungs- Clear to ausculation bilaterally, normal work of breathing Heart- Regular rate and rhythm  GI- soft, NT, ND, + BS Extremities- no clubbing, cyanosis, or edema Skin- no rash or lesion, left chest without hematoma/ecchymosis Psych- euthymic mood, full affect Neuro- strength and sensation are intact  LABS: Basic Metabolic Panel:  Recent Labs  01/12/15 01/13/15 0334  NA  --  134*  K  --  4.0  CL  --  99*  CO2  --  26  GLUCOSE  --  95  BUN  --  10  CREATININE  --  0.79  CALCIUM  --  9.2  MG 1.8 1.9    RADIOLOGY: Dg Chest Port 1 View 01/08/2015  CLINICAL DATA:  Status post cardiopulmonary resuscitation. EXAM: PORTABLE CHEST 1 VIEW COMPARISON:  January 30, 2012. FINDINGS: The heart size and mediastinal contours are within normal limits. No pneumothorax or pleural effusion is noted. Left lung  is clear. Mild right perihilar interstitial densities are noted which may represent edema or possibly inflammation. The visualized skeletal structures are unremarkable. IMPRESSION: Mild right perihilar interstitial densities are noted concerning for edema or inflammation. Electronically Signed   By: Marijo Conception, M.D.   On: 01/08/2015 13:41   Mr Card Morphology Wo/w Cm 01/11/2015  CLINICAL DATA:  49 year old male with EXAM: CARDIAC MRI TECHNIQUE: The patient was scanned on a 1.5 Tesla GE magnet. A dedicated cardiac coil was used. Functional imaging was done using Fiesta sequences. 2,3, and 4 chamber views were done to assess for RWMA's. Modified Simpson's rule using a short axis stack was used to calculate an ejection fraction on a dedicated work Conservation officer, nature. The patient received 24 cc of Multihance. After 10 minutes inversion recovery sequences were used to assess for infiltration and scar tissue. CONTRAST:  24 cc of Multihance FINDINGS: 1. Normal left ventricular size with mild concentric hypertrophy and normal systolic function (LVEF = 56%) with no regional wall motion abnormalities. LVEDD:  44 mm LVESD:  35 mm LVEDV:  125 ml LVESV:  55 ml SV:  70 ml CO:  5.5 L/min Myocardial mass:  158 g 2. Normal right ventricular size, thickness and midly impaired systolic function (RVEF = 44%). There is dyskinesis of the RV free wall at the superior attachment to the LV wall. RVEDV:  160 ml RVESV:  89 ml SV:  70 ml CO:  5.5 L.minute 3.  Normal biatrial size. 4.  Trivial mitral and mild tricuspid regurgitation. 5. Normal size of the aortic root, ascending aorta and pulmonary artery. 6. No late gadolinium enhancement is seen in the myocardium of the left ventricle. There is late gadolinium enhancement in the RV free wall at the superior attachment to the LV wall. IMPRESSION: 1. Normal left ventricular size with mild concentric hypertrophy and normal systolic function (LVEF = 56%) with no regional wall  motion abnormalities. There is dyskinesis (a small aneurysm) of the RV free wall at the superior attachment to the LV wall associated with late gadolinium enhancement. This is suspicious for an arrhythmogenic right ventricular dysplasia. 2. Normal right ventricular size, thickness and mildly impaired systolic function (RVEF = 44%). 3.  Trivial mitral and mild tricuspid regurgitation. [name] Electronically Signed   By: [name]   On: 01/11/2015 17:48    ASSESSMENT AND PLAN:  Active Problems:   Parotid mass   Cardiac arrest ([place])   Hypotension   Hyponatremia   Tobacco use disorder   Alcohol use (HCC)   Essential hypertension   Ventricular fibrillation (HCC)   CAD (coronary artery disease)   Mild left ventricular systolic dysfunction   Hypomagnesemia   Right bundle branch block    1.  VF arrest/ARVD S/p ICD implant 01-12-15 CXR with lead in stable position, no obvious ptx Device interrogation reviewed and normal No driving x6 months Keep K >3.9, Mg >1.8 Will need genetic testing as an outpatient Routine wound care and follow up  2.  Parotid mass Management per surgery  Ok to discharge home from EP standpoint. Instructions reviewed with patient and entered in AVS. Follow up scheduled and entered in AVS.    Chanetta Marshall, NP 01/13/2015 7:10 AM   I have seen, examined the patient, and reviewed the above assessment and plan.  Device interrogation reviewed.  Changes to above are made where necessary.    Co Sign: Thompson Grayer, MD 01/13/2015 9:44 PM

## 2015-01-13 NOTE — Progress Notes (Signed)
Subjective: POD#5 from aborted right parotidectomy due to intraop Vfib requiring defibrillation x 3. Had ICD implanted yesterday, doing well. CMRI showed right ventricular dysplasia  Objective: Vital signs in last 24 hours: Temp:  [97.5 F (36.4 C)-98.5 F (36.9 C)] 98.5 F (36.9 C) (10/19 0513) Pulse Rate:  [0-102] 76 (10/19 0513) Resp:  [0-58] 18 (10/19 0513) BP: (114-161)/(70-110) 124/82 mmHg (10/19 0513) SpO2:  [0 %-100 %] 98 % (10/19 0513)  Awake, alert, right modified Blair incision healing well with absorbable gut sutures, stable right parotid mass. Facial nerve intact. Arm in sling from ICD procedure  @LABLAST2 (wbc:2,hgb:2,hct:2,plt:2)  Recent Labs  01/13/15 0334  NA 134*  K 4.0  CL 99*  CO2 26  GLUCOSE 95  BUN 10  CREATININE 0.79  CALCIUM 9.2    Medications:  Scheduled Meds: . atorvastatin  80 mg Oral q1800  . neomycin-bacitracin-polymyxin   Topical TID  . nicotine  21 mg Transdermal Daily  . pantoprazole  40 mg Oral QHS  . sodium chloride  3 mL Intravenous Q12H   Continuous Infusions: . sodium chloride    . sodium chloride    . sodium chloride     PRN Meds:.sodium chloride, acetaminophen, HYDROcodone-acetaminophen, ondansetron (ZOFRAN) IV, sodium chloride  Assessment/Plan: Stable POD#5 from aborted right parotidectomy, POD#1 from ICD implantation. Can be discharged from ENT standpoint whenever cleared by electrophysiology and cardiology for discharge. My nurse will set him up for outpatient referral to [facility] ENT for removal of the right parotid mass. Sutures are all absorbable and he can clean incision with soap and water ad lib. His family has his hydrocodone/acetaminophen prescription from pre-op.   LOS: 5 days   Ruby Cola 01/13/2015, 6:51 AM

## 2015-01-13 NOTE — Telephone Encounter (Addendum)
Spoke with Chanetta Marshall, NP and he is cleared to return to work on Wed 01/20/15  No driving for 6 months.  I have asked he call me back to let me know where to send letter

## 2015-01-13 NOTE — Telephone Encounter (Signed)
F/u  Pt returning [facility] phone call. Please call back and discuss.

## 2015-01-13 NOTE — Discharge Summary (Signed)
01/13/2015  8:57 AM  Date of Admission: 01/08/2015 Date of Discharge: 01/13/2015  Discharge MD: Ruby Cola, MD  Admitting MD: Ruby Cola, MD  Reason for admission/final discharge diagnosis: right parotid mass/ventricular dysplasia/Vfib arrest/Takotsubo's cardiomyopathy  Labs: see EPIC  Procedure(s) performed: 01/08/15 right aborted parotidectomy, 01/13/15 ICD  Discharge Condition: stable  Discharge Exam: facial nerve intact, right modified blair incision intact with absorbable sutures, stable parotid mass on right  Discharge Instructions: follow up with [facility] cardiology/electrophysiology as scheduled. Dr. Theressa Millard office will arrange outpatient referral to [facility] ENT for removal of right parotid mass, facial sutures are absorbable, use OTC neosporin on incision TID and clean with soap and water ad lib.  Hospital Course: admitted for right parotidectomy 01/08/15 which had to be aborted before tumor removal as patient had Vfib arrest requiring defibrillation x 3 and IV medications. Post-op patient with no neurological defects. Cardiology and Electrophysiology consulted, patient found to have Takotsubo's cardiomyopathy and right ventricular dysplasia, had implantable defibrillator placed 01/13/15 and cleared for discharge.  Ruby Cola 8:57 AM 01/13/2015

## 2015-01-13 NOTE — Telephone Encounter (Signed)
Will print out and leave out front for him  He will pick up tomorrow

## 2015-01-13 NOTE — Telephone Encounter (Signed)
New Message  Pt states that upon discharge he was supposed to receive a work note as to when he would be able to return. Pt states he did not retrive this note. He is asking if Dr. Bonita Quin nurse can help him with this. Please call

## 2015-01-15 ENCOUNTER — Telehealth: Payer: Self-pay | Admitting: Internal Medicine

## 2015-01-15 NOTE — Telephone Encounter (Signed)
F/u      Pt calling stating that he would like that doctors note faxed to his employer at [phone]. Please call back and advise.

## 2015-01-15 NOTE — Telephone Encounter (Signed)
Called and verified patient employer fax number. Will fax over.

## 2015-01-21 ENCOUNTER — Encounter: Payer: Self-pay | Admitting: Internal Medicine

## 2015-01-21 ENCOUNTER — Encounter: Payer: Self-pay | Admitting: *Deleted

## 2015-01-21 ENCOUNTER — Ambulatory Visit (INDEPENDENT_AMBULATORY_CARE_PROVIDER_SITE_OTHER): Payer: BLUE CROSS/BLUE SHIELD | Admitting: *Deleted

## 2015-01-21 DIAGNOSIS — I469 Cardiac arrest, cause unspecified: Secondary | ICD-10-CM | POA: Diagnosis not present

## 2015-01-21 LAB — CUP PACEART INCLINIC DEVICE CHECK
Battery Voltage: 3.08 V
Brady Statistic RV Percent Paced: 0 %
HIGH POWER IMPEDANCE MEASURED VALUE: 68 Ohm
Implantable Lead Implant Date: 20161018
Lead Channel Pacing Threshold Pulse Width: 0.4 ms
Lead Channel Sensing Intrinsic Amplitude: 10.875 mV
Lead Channel Sensing Intrinsic Amplitude: 9.125 mV
Lead Channel Setting Pacing Amplitude: 3.5 V
MDC IDC LEAD LOCATION: 753860
MDC IDC MSMT BATTERY REMAINING LONGEVITY: 136 mo
MDC IDC MSMT LEADCHNL RV IMPEDANCE VALUE: 361 Ohm
MDC IDC MSMT LEADCHNL RV IMPEDANCE VALUE: 475 Ohm
MDC IDC MSMT LEADCHNL RV PACING THRESHOLD AMPLITUDE: 1.125 V
MDC IDC SESS DTM: 20161027124710
MDC IDC SET LEADCHNL RV PACING PULSEWIDTH: 0.4 ms
MDC IDC SET LEADCHNL RV SENSING SENSITIVITY: 0.3 mV

## 2015-01-21 NOTE — Progress Notes (Signed)
Wound check appointment. Steri-strips removed. Wound without redness or edema. Incision edges approximated, wound well healed. Normal device function. Threshold, sensing, and impedances consistent with implant measurements. Device programmed at 3.5V for extra safety margin until 3 month visit. Histogram distribution appropriate for patient and level of activity. No ventricular arrhythmias noted. Patient educated about wound care, arm mobility, lifting restrictions, shock plan. ROV w/ AS 03/08/15 & w/ JA/E 04/30/15.

## 2015-03-07 ENCOUNTER — Encounter: Payer: Self-pay | Admitting: Physician Assistant

## 2015-03-07 NOTE — Progress Notes (Signed)
Cardiology Office Note Date:  03/08/2015  Patient ID:  Stephen, Hays 09/22/1965, MRN [id number] PCP:  Stephen Neighbors, MD  Cardiologist:  Dr. Claiborne Billings Electrophysiologist: Dr. Rayann Heman   Chief Complaint: post hospital f/u  History of Present Illness: Stephen Hays is a 49 y.o. male with history of parotid mass, VF arrest interop (for mass) s/p ICD implant 01/12/15, HTN, comes in today feeling physically well, he denies any kind of CP, palpitations or SOB, no exertional intolerances.  He reports today feeling very nervous about his visit, states he always gets nervous, but particularly today discussing his heart problem.  He has not had any therapies from his device.  He was able to complete his parotid mass surgery without complications.  He reports he continues to smoke, but working on it, has signed up for a help stop smoking program through work, but admits with his anxiety, has a cigarette just prior to coming in.  He continues to use ETOH, mostly weekends, but has a couple drinks weeknights on occasion.  He is counseled at length regarding this.   Past Medical History  Diagnosis Date  . Hypertension   . parotid mass   . 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 Rayann Heman)    Past Surgical History  Procedure Laterality Date  . Back surgery    . Incision and drainage      on back near buttocks  . Cardiac catheterization N/A 01/08/2015    Procedure: Left Heart Cath and Coronary Angiography;  Surgeon: Troy Sine, MD;  Location: [place] [facility];  Service: Cardiovascular;  Laterality: N/A;  . Parotidectomy Right 01/08/2015    Procedure: ABORTED PAROTIDECTOMY;  Surgeon: Ruby Cola, MD;  Location: [facility] OR;  Service: ENT;  Laterality: Right;  . Ep implantable device N/A 01/12/2015    Procedure: ICD Implant;  Surgeon: Thompson Grayer, MD;  Location: [place] [facility];  Service: Cardiovascular;  Laterality: N/A;    Current Outpatient  Prescriptions  Medication Sig Dispense Refill  . atorvastatin (LIPITOR) 80 MG tablet Take 1 tablet (80 mg total) by mouth daily at 6 PM. 30 tablet 1  . Azilsartan-Chlorthalidone 40-25 MG TABS Take 1 tablet by mouth daily. EDARBYCLOR    . HYDROcodone-acetaminophen (NORCO/VICODIN) 5-325 MG tablet Take 1-2 tablets by mouth every 4 (four) hours as needed for moderate pain. 60 tablet 0  . neomycin-bacitracin-polymyxin (NEOSPORIN) OINT Apply 1 application topically 3 (three) times daily. 1 Tube 1   No current facility-administered medications for this visit.    Allergies:   Doxycycline and Prednisone   Social History:  The patient  reports that he has been smoking.  He does not have any smokeless tobacco history on file. He reports that he drinks about 3.6 - 4.2 oz of alcohol per week. He reports that he does not use illicit drugs.   Family History:  The patient's family history includes COPD in his mother; Cancer - Lung in his father; Hypertension in his mother; Stroke in his brother and sister.  ROS:  Please see the history of present illness.    All other systems are reviewed and otherwise negative.   PHYSICAL EXAM: Manual recheck of his BP after sitting and talking a while is 160/98 VS:  BP 174/116 mmHg  Pulse 118  Ht 5\' 10"  (1.778 m)  Wt 204 lb (92.534 kg)  BMI 29.27 kg/m2 BMI: Body mass index is 29.27 kg/(m^2). Well nourished,  well developed, in no acute distress, but appears anxious HEENT: normocephalic, atraumatic Neck: no JVD, carotid bruits or masses Cardiac:  normal S1, S2; RRR; no significant murmurs, no rubs, or gallops Lungs:  clear to auscultation bilaterally, no wheezing, rhonchi or rales Abd: soft, nontender,  + BS MS: no deformity or atrophy Ext: no edema Skin: warm and dry, no rash Neuro:  No gross deficits appreciated Psych: euthymic mood, full affect  ICD site is well healed, stable, no tethering or discomfort   EKG:  Done today shows ST, RBBB (120bpm) ICD  check today shows no events or therapies, normal function  01/08/14: Echocardiogram Study Conclusions - Left ventricle: The cavity size was normal. Wall thickness was normal. Systolic function was vigorous. The estimated ejection fraction was in the range of 65% to 70%. Doppler parameters are consistent with abnormal left ventricular relaxation (grade 1 diastolic dysfunction).  01/08/15 LHC Conclusion     Prox LAD lesion, 30% stenosed.  Mid LAD lesion, 15% stenosed.  The left ventricular systolic function is normal   Mr Card Morphology Wo/w Cm 01/11/2015  IMPRESSION: 1. Normal left ventricular size with mild concentric hypertrophy and normal systolic function (LVEF = 56%) with no regional wall motion abnormalities. There is dyskinesis (a small aneurysm) of the RV free wall at the superior attachment to the LV wall associated with late gadolinium enhancement. This is suspicious for an arrhythmogenic right ventricular dysplasia. 2. Normal right ventricular size, thickness and mildly impaired systolic function (RVEF = 44%). 3. Trivial mitral and mild tricuspid regurgitation.    Recent Labs: 01/08/2015: B Natriuretic Peptide 15.5 01/09/2015: ALT 249*; Hemoglobin 14.2; Platelets 199 01/13/2015: BUN 10; Creatinine, Ser 0.79; Magnesium 1.9; Potassium 4.0; Sodium 134*  No results found for requested labs within last 365 days.   CrCl cannot be calculated (Patient has no serum creatinine result on file.).   Wt Readings from Last 3 Encounters:  03/08/15 204 lb (92.534 kg)  01/09/15 205 lb 3.2 oz (93.078 kg)  01/07/15 205 lb 4.8 oz (93.123 kg)     Other studies reviewed: Additional studies/records reviewed today include: summarized above  DEVICE: Medtronic Visa AF MRI (serial Number [id number]) ICD.implanted 01/12/15, Dr. Rayann Heman  ASSESSMENT AND PLAN:  1. VF arrest (inter-op) MDT ICD      cardiac MR suspicious for arrhythmogenic RV dyslplasia       genetic testing  recommended in the hospital, the patient will be called to arrange (staff message sent to [place] to contact the patient.       no driving x 6 month post arrest, he is aware       q 3 month carelink  2. HTN      He reports at home yesterday 117/75, sometimes a little higher, he is working with his PMD with this, sees him tomorrow     Asked to continue to monitor his home BP routinely and continue with his PMD  3. Parotid mass     He was able to complete this surgery  4. Tobacco/ETOH       counselled  5. tachycardia      Likely multifactorial, he is quite nervous about today's visit, + smoking      Reassured patient  Disposition: F/u with Dr. Rayann Heman as scheduled  Current medicines are reviewed at length with the patient today.  The patient did not have any concerns regarding medicines.  Haywood Lasso, PA-C 03/08/2015 9:32 AM     [address]  [address] [place] [address] [phone] (office)  [phone] (fax)

## 2015-03-08 ENCOUNTER — Encounter: Payer: Self-pay | Admitting: Physician Assistant

## 2015-03-08 ENCOUNTER — Encounter: Payer: BLUE CROSS/BLUE SHIELD | Admitting: Nurse Practitioner

## 2015-03-08 ENCOUNTER — Encounter: Payer: Self-pay | Admitting: *Deleted

## 2015-03-08 ENCOUNTER — Ambulatory Visit (INDEPENDENT_AMBULATORY_CARE_PROVIDER_SITE_OTHER): Payer: BLUE CROSS/BLUE SHIELD | Admitting: Physician Assistant

## 2015-03-08 ENCOUNTER — Encounter: Payer: Self-pay | Admitting: Internal Medicine

## 2015-03-08 VITALS — BP 174/116 | HR 118 | Ht 70.0 in | Wt 204.0 lb

## 2015-03-08 DIAGNOSIS — I10 Essential (primary) hypertension: Secondary | ICD-10-CM | POA: Diagnosis not present

## 2015-03-08 DIAGNOSIS — I469 Cardiac arrest, cause unspecified: Secondary | ICD-10-CM | POA: Diagnosis not present

## 2015-03-08 DIAGNOSIS — I4901 Ventricular fibrillation: Secondary | ICD-10-CM

## 2015-03-08 LAB — CUP PACEART INCLINIC DEVICE CHECK
Date Time Interrogation Session: 20161212153223
HIGH POWER IMPEDANCE MEASURED VALUE: 59 Ohm
Implantable Lead Location: 753860
Lead Channel Impedance Value: 418 Ohm
Lead Channel Pacing Threshold Pulse Width: 0.4 ms
Lead Channel Setting Pacing Amplitude: 2.5 V
Lead Channel Setting Sensing Sensitivity: 0.3 mV
MDC IDC LEAD IMPLANT DT: 20161018
MDC IDC MSMT BATTERY REMAINING LONGEVITY: 136 mo
MDC IDC MSMT BATTERY VOLTAGE: 3.13 V
MDC IDC MSMT LEADCHNL RV IMPEDANCE VALUE: 304 Ohm
MDC IDC MSMT LEADCHNL RV PACING THRESHOLD AMPLITUDE: 1.25 V
MDC IDC MSMT LEADCHNL RV SENSING INTR AMPL: 10.75 mV
MDC IDC SET LEADCHNL RV PACING PULSEWIDTH: 0.4 ms
MDC IDC STAT BRADY RV PERCENT PACED: 0.01 %

## 2015-03-08 MED ORDER — ATORVASTATIN CALCIUM 80 MG PO TABS: 80.0000 mg | ORAL_TABLET | Freq: Every day | ORAL | Status: DC

## 2015-03-08 NOTE — Patient Instructions (Signed)
Medication Instructions:   CONTINUE ON SAME MEDICATIONS    If you need a refill on your cardiac medications before your next appointment, please call your pharmacy.  Labwork:  NONE ORDER TODAY    Testing/Procedures:  NONE ORDER TODAY    Follow-Up:  IN January  WITH  DR Rayann Heman   Any Other Special Instructions Will Be Listed Below (If Applicable).

## 2015-03-24 ENCOUNTER — Telehealth: Payer: Self-pay | Admitting: Internal Medicine

## 2015-03-24 NOTE — Telephone Encounter (Signed)
Message received from [place], [place] to contact the patient regarding genetic testing for RV dysplasia. I spoke with the patient today and explained the process to him. He states he is concerned with his finances at this time and did not wish to pursue genetic testing at this time. I advised him if he decides that he would like [place] to send his insurance/ medical information to [employer] to review prior to any lab draw, I would be glad to send this for him. He verbalizes understanding.

## 2015-04-12 ENCOUNTER — Telehealth: Payer: Self-pay | Admitting: Internal Medicine

## 2015-04-12 NOTE — Telephone Encounter (Signed)
Patient has refills at the pharmacy. He was not aware of this. He will call them.

## 2015-04-12 NOTE — Telephone Encounter (Signed)
New message      When can pt start lifting heavier things.  He got his defibulator 3 months ago.  Please call

## 2015-04-12 NOTE — Telephone Encounter (Signed)
Stephen Hays, CCDS, spoke with patient and advised that he can resume normal lifting, but to avoid fork lift for 3 months.  Also advised that patient may resume climbing on ladders.  Patient appreciative of instructions and denies questions or concerns at this time.

## 2015-04-12 NOTE — Telephone Encounter (Signed)
New message       *STAT* If patient is at the pharmacy, call can be transferred to refill team.   1. Which medications need to be refilled? (please list name of each medication and dose if known) atorvastatin 80mg  2. Which pharmacy/location (including street and city if local pharmacy) is medication to be sent to? [facility] [place]  3. Do they need a 30 day or 90 day supply? 30 day

## 2015-04-30 ENCOUNTER — Encounter: Payer: Self-pay | Admitting: Internal Medicine

## 2015-04-30 ENCOUNTER — Ambulatory Visit (INDEPENDENT_AMBULATORY_CARE_PROVIDER_SITE_OTHER): Payer: BLUE CROSS/BLUE SHIELD | Admitting: Internal Medicine

## 2015-04-30 VITALS — BP 142/88 | HR 77 | Ht 70.0 in | Wt 210.2 lb

## 2015-04-30 DIAGNOSIS — I2510 Atherosclerotic heart disease of native coronary artery without angina pectoris: Secondary | ICD-10-CM

## 2015-04-30 DIAGNOSIS — I4901 Ventricular fibrillation: Secondary | ICD-10-CM

## 2015-04-30 DIAGNOSIS — Q208 Other congenital malformations of cardiac chambers and connections: Secondary | ICD-10-CM | POA: Insufficient documentation

## 2015-04-30 DIAGNOSIS — Q249 Congenital malformation of heart, unspecified: Secondary | ICD-10-CM

## 2015-04-30 DIAGNOSIS — Z959 Presence of cardiac and vascular implant and graft, unspecified: Secondary | ICD-10-CM

## 2015-04-30 LAB — CUP PACEART INCLINIC DEVICE CHECK
Battery Voltage: 3.09 V
HighPow Impedance: 70 Ohm
Implantable Lead Implant Date: 20161018
Implantable Lead Location: 753860
Lead Channel Pacing Threshold Pulse Width: 0.4 ms
Lead Channel Sensing Intrinsic Amplitude: 7.375 mV
Lead Channel Setting Pacing Amplitude: 2.5 V
Lead Channel Setting Pacing Pulse Width: 0.4 ms
MDC IDC MSMT BATTERY REMAINING LONGEVITY: 135 mo
MDC IDC MSMT LEADCHNL RV IMPEDANCE VALUE: 342 Ohm
MDC IDC MSMT LEADCHNL RV IMPEDANCE VALUE: 456 Ohm
MDC IDC MSMT LEADCHNL RV PACING THRESHOLD AMPLITUDE: 1.25 V
MDC IDC MSMT LEADCHNL RV SENSING INTR AMPL: 9.375 mV
MDC IDC SESS DTM: 20170203150720
MDC IDC SET LEADCHNL RV SENSING SENSITIVITY: 0.3 mV
MDC IDC STAT BRADY RV PERCENT PACED: 0.01 %

## 2015-04-30 NOTE — Patient Instructions (Addendum)
Your physician recommends that you continue on your current medications as directed. Please refer to the Current Medication list given to you today. Device check on 08/02/15. Your physician recommends that you schedule a follow-up appointment in 6 months with Dr. Rayann Heman.

## 2015-04-30 NOTE — Progress Notes (Signed)
Electrophysiology Office Note   Date:  04/30/2015   ID:  Bertel, Konefal 24-Sep-1965, MRN [id number]  PCP:  Wende Neighbors, MD  Cardiologist:  Dr Claiborne Billings Primary Electrophysiologist: Thompson Grayer, MD    CC: prior sudden death   History of Present Illness: Stephen Hays is a 50 y.o. male who presents today for electrophysiology evaluation.   He has done well since his ICD was implanted.  Denies procedure related complications.  Pleased with current health state.  Remains active.  Continues to smoke.  Today, he denies symptoms of palpitations, chest pain, shortness of breath, orthopnea, PND, lower extremity edema, claudication, dizziness, presyncope, syncope, bleeding, or neurologic sequela. The patient is tolerating medications without difficulties and is otherwise without complaint today.    Past Medical History  Diagnosis Date  . Hypertension   . parotid mass   . 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 Rayann Heman)  . CAD in native artery 12/2014    nonobstructive by cath  . Right ventricular dysplasia   . Tobacco abuse    Past Surgical History  Procedure Laterality Date  . Back surgery    . Incision and drainage      on back near buttocks  . Cardiac catheterization N/A 01/08/2015    Procedure: Left Heart Cath and Coronary Angiography;  Surgeon: Troy Sine, MD;  Location: [place] [facility];  Service: Cardiovascular;  Laterality: N/A;  . Parotidectomy Right 01/08/2015    Procedure: ABORTED PAROTIDECTOMY;  Surgeon: Ruby Cola, MD;  Location: [facility] OR;  Service: ENT;  Laterality: Right;  . Ep implantable device N/A 01/12/2015    Medtronic Visa AF MRI compatible ICD implanted by Dr Rayann Heman for secondary prevention of sudden death     Current Outpatient Prescriptions  Medication Sig Dispense Refill  . atorvastatin (LIPITOR) 80 MG tablet Take 1 tablet (80 mg total) by mouth daily at 6 PM. 30 tablet 5  . Azilsartan-Chlorthalidone 40-25 MG  TABS Take 1 tablet by mouth daily. EDARBYCLOR     No current facility-administered medications for this visit.    Allergies:   Doxycycline and Prednisone   Social History:  The patient  reports that he has been smoking.  He does not have any smokeless tobacco history on file. He reports that he drinks about 3.6 - 4.2 oz of alcohol per week. He reports that he does not use illicit drugs.   Family History:  The patient's family history includes COPD in his mother; Cancer - Lung in his father; Hypertension in his mother; Stroke in his brother and sister.    ROS:  Please see the history of present illness.   All other systems are reviewed and negative.    PHYSICAL EXAM: VS:  BP 142/88 mmHg  Pulse 77  Ht 5\' 10"  (1.778 m)  Wt 210 lb 3.2 oz (95.346 kg)  BMI 30.16 kg/m2  SpO2 91% , BMI Body mass index is 30.16 kg/(m^2). GEN: Well nourished, well developed, in no acute distress HEENT: normal Neck: no JVD, carotid bruits, or masses Cardiac: RRR; no murmurs, rubs, or gallops,no edema  Respiratory:  clear to auscultation bilaterally, normal work of breathing GI: soft, nontender, nondistended, + BS MS: no deformity or atrophy Skin: warm and dry, device pocket is well healed Neuro:  Strength and sensation are intact Psych: euthymic mood, full affect  Device interrogation is reviewed today in detail.  See PaceArt for details.  Recent Labs: 01/08/2015: B Natriuretic Peptide 15.5 01/09/2015: ALT 249*; Hemoglobin 14.2; Platelets 199 01/13/2015: BUN 10; Creatinine, Ser 0.79; Magnesium 1.9; Potassium 4.0; Sodium 134*    Lipid Panel  No results found for: CHOL, TRIG, HDL, CHOLHDL, VLDL, LDLCALC, LDLDIRECT   Wt Readings from Last 3 Encounters:  04/30/15 210 lb 3.2 oz (95.346 kg)  03/08/15 204 lb (92.534 kg)  01/09/15 205 lb 3.2 oz (93.078 kg)    MRI, cath, and echo reviewed with the patient today  ASSESSMENT AND PLAN:  1.  ARVD Doing well s/p ICD implantation Normal ICD  function See Pace Art report No changes today We discussed genetic testing again today.  He does not feel that he can afford testing currently but may be willing to reconsider in the future No driving until 6 months post VF arrest (pt aware and willing to comply)  2. CAD Nonobstructive Continue atorvastatin  3. Tobacco Cessation advised  4. HTN Stable No change required today  carelink  Follow-up: return to see me in 6 months  Current medicines are reviewed at length with the patient today.   The patient does not have concerns regarding his medicines.  The following changes were made today:  none   Signed, Thompson Grayer, MD  04/30/2015 1:01 PM     [place] [address] [phone] (office) [phone] (fax)

## 2015-05-05 ENCOUNTER — Other Ambulatory Visit: Payer: Self-pay | Admitting: *Deleted

## 2015-05-05 MED ORDER — ATORVASTATIN CALCIUM 80 MG PO TABS: 80.0000 mg | ORAL_TABLET | Freq: Every day | ORAL | Status: DC

## 2015-07-19 DIAGNOSIS — L821 Other seborrheic keratosis: Secondary | ICD-10-CM | POA: Diagnosis not present

## 2015-07-19 DIAGNOSIS — D225 Melanocytic nevi of trunk: Secondary | ICD-10-CM | POA: Diagnosis not present

## 2015-07-19 DIAGNOSIS — D485 Neoplasm of uncertain behavior of skin: Secondary | ICD-10-CM | POA: Diagnosis not present

## 2015-07-19 DIAGNOSIS — A630 Anogenital (venereal) warts: Secondary | ICD-10-CM | POA: Diagnosis not present

## 2015-08-02 ENCOUNTER — Telehealth: Payer: Self-pay | Admitting: Internal Medicine

## 2015-08-02 ENCOUNTER — Ambulatory Visit (INDEPENDENT_AMBULATORY_CARE_PROVIDER_SITE_OTHER): Payer: BLUE CROSS/BLUE SHIELD | Admitting: *Deleted

## 2015-08-02 ENCOUNTER — Telehealth: Payer: Self-pay | Admitting: Cardiology

## 2015-08-02 DIAGNOSIS — Z959 Presence of cardiac and vascular implant and graft, unspecified: Secondary | ICD-10-CM | POA: Diagnosis not present

## 2015-08-02 DIAGNOSIS — I4901 Ventricular fibrillation: Secondary | ICD-10-CM | POA: Diagnosis not present

## 2015-08-02 DIAGNOSIS — I469 Cardiac arrest, cause unspecified: Secondary | ICD-10-CM

## 2015-08-02 NOTE — Telephone Encounter (Signed)
LMOVM reminding pt to send remote transmission.   

## 2015-08-02 NOTE — Telephone Encounter (Signed)
Pt calling back again to have someone walk him through a transmission per [facility] call  [phone]

## 2015-08-02 NOTE — Telephone Encounter (Signed)
Spoke w/ pt and instructed him how to send a remote transmission. Pt verbalized understanding.

## 2015-08-02 NOTE — Telephone Encounter (Signed)
New Message  Pt called request a call back to discuss remote device check. States that he received a call to check his defibrillator. Please call

## 2015-08-03 NOTE — Progress Notes (Signed)
Remote ICD transmission.   

## 2015-09-07 LAB — CUP PACEART REMOTE DEVICE CHECK
Brady Statistic RV Percent Paced: 0.01 %
Date Time Interrogation Session: 20170508190137
HIGH POWER IMPEDANCE MEASURED VALUE: 68 Ohm
Implantable Lead Implant Date: 20161018
Lead Channel Impedance Value: 304 Ohm
Lead Channel Pacing Threshold Amplitude: 1.625 V
Lead Channel Sensing Intrinsic Amplitude: 5.75 mV
Lead Channel Setting Sensing Sensitivity: 0.3 mV
MDC IDC LEAD LOCATION: 753860
MDC IDC MSMT BATTERY REMAINING LONGEVITY: 134 mo
MDC IDC MSMT BATTERY VOLTAGE: 3.05 V
MDC IDC MSMT LEADCHNL RV IMPEDANCE VALUE: 399 Ohm
MDC IDC MSMT LEADCHNL RV PACING THRESHOLD PULSEWIDTH: 0.4 ms
MDC IDC MSMT LEADCHNL RV SENSING INTR AMPL: 5.75 mV
MDC IDC SET LEADCHNL RV PACING AMPLITUDE: 3.25 V
MDC IDC SET LEADCHNL RV PACING PULSEWIDTH: 0.4 ms

## 2015-09-08 ENCOUNTER — Encounter: Payer: Self-pay | Admitting: Cardiology

## 2015-10-03 ENCOUNTER — Other Ambulatory Visit: Payer: Self-pay | Admitting: Cardiovascular Disease

## 2015-10-04 DIAGNOSIS — I10 Essential (primary) hypertension: Secondary | ICD-10-CM | POA: Diagnosis not present

## 2015-10-04 DIAGNOSIS — Z720 Tobacco use: Secondary | ICD-10-CM | POA: Diagnosis not present

## 2015-10-27 DIAGNOSIS — E782 Mixed hyperlipidemia: Secondary | ICD-10-CM | POA: Diagnosis not present

## 2015-10-27 DIAGNOSIS — R7301 Impaired fasting glucose: Secondary | ICD-10-CM | POA: Diagnosis not present

## 2015-10-27 DIAGNOSIS — E291 Testicular hypofunction: Secondary | ICD-10-CM | POA: Diagnosis not present

## 2015-10-29 ENCOUNTER — Encounter: Payer: Self-pay | Admitting: Internal Medicine

## 2015-10-29 ENCOUNTER — Ambulatory Visit (INDEPENDENT_AMBULATORY_CARE_PROVIDER_SITE_OTHER): Payer: BLUE CROSS/BLUE SHIELD | Admitting: Internal Medicine

## 2015-10-29 VITALS — BP 149/96 | HR 95 | Ht 70.0 in | Wt 206.4 lb

## 2015-10-29 DIAGNOSIS — I4901 Ventricular fibrillation: Secondary | ICD-10-CM | POA: Diagnosis not present

## 2015-10-29 DIAGNOSIS — I469 Cardiac arrest, cause unspecified: Secondary | ICD-10-CM

## 2015-10-29 DIAGNOSIS — I2510 Atherosclerotic heart disease of native coronary artery without angina pectoris: Secondary | ICD-10-CM | POA: Diagnosis not present

## 2015-10-29 DIAGNOSIS — Q249 Congenital malformation of heart, unspecified: Secondary | ICD-10-CM

## 2015-10-29 DIAGNOSIS — I428 Other cardiomyopathies: Secondary | ICD-10-CM

## 2015-10-29 DIAGNOSIS — Q208 Other congenital malformations of cardiac chambers and connections: Secondary | ICD-10-CM

## 2015-10-29 LAB — CUP PACEART INCLINIC DEVICE CHECK
Brady Statistic RV Percent Paced: 0.01 %
HIGH POWER IMPEDANCE MEASURED VALUE: 70 Ohm
Implantable Lead Implant Date: 20161018
Lead Channel Impedance Value: 418 Ohm
Lead Channel Pacing Threshold Amplitude: 1.625 V
Lead Channel Sensing Intrinsic Amplitude: 8.375 mV
Lead Channel Setting Pacing Amplitude: 3.5 V
MDC IDC LEAD LOCATION: 753860
MDC IDC MSMT BATTERY REMAINING LONGEVITY: 133 mo
MDC IDC MSMT BATTERY VOLTAGE: 3.02 V
MDC IDC MSMT LEADCHNL RV IMPEDANCE VALUE: 304 Ohm
MDC IDC MSMT LEADCHNL RV PACING THRESHOLD PULSEWIDTH: 0.4 ms
MDC IDC SESS DTM: 20170804150943
MDC IDC SET LEADCHNL RV PACING PULSEWIDTH: 0.4 ms
MDC IDC SET LEADCHNL RV SENSING SENSITIVITY: 0.3 mV

## 2015-10-29 MED ORDER — LISINOPRIL 20 MG PO TABS: 20.0000 mg | ORAL_TABLET | Freq: Every day | ORAL | 6 refills | Status: DC

## 2015-10-29 MED ORDER — CARVEDILOL 6.25 MG PO TABS: 6.2500 mg | ORAL_TABLET | Freq: Two times a day (BID) | ORAL | 6 refills | Status: DC

## 2015-10-29 NOTE — Patient Instructions (Signed)
Medication Instructions:   Stop Edarbyclor.  Begin Lisinopril 20mg  daily.  Begin Coreg 6.25mg  twice a day.  Continue all other medications.    Labwork: None.  Testing/Procedures:  Your physician has requested that you have an echocardiogram. Echocardiography is a painless test that uses sound waves to create images of your heart. It provides your doctor with information about the size and shape of your heart and how well your heart's chambers and valves are working. This procedure takes approximately one hour. There are no restrictions for this procedure.  Office will contact with results via phone or letter.    Follow-Up: Your physician wants you to follow up in: 6 months.  You will receive a reminder letter in the mail one-two months in advance.  If you don't receive a letter, please call our office to schedule the follow up appointment - Dr. Rayann Heman.   Any Other Special Instructions Will Be Listed Below (If Applicable). Remote monitoring is used to monitor your Pacemaker of ICD from home. This monitoring reduces the number of office visits required to check your device to one time per year. It allows [place] to keep an eye on the functioning of your device to ensure it is working properly. You are scheduled for a device check from home on AUTOMATICLY. You may send your transmission at any time that day. If you have a wireless device, the transmission will be sent automatically. After your physician reviews your transmission, you will receive a postcard with your next transmission date.   If you need a refill on your cardiac medications before your next appointment, please call your pharmacy.

## 2015-10-31 NOTE — Progress Notes (Signed)
Electrophysiology Office Note   Date:  10/31/2015   ID:  Khairi, Caron Jan 11, 1966, MRN [id number]  PCP:  Wende Neighbors, MD  Cardiologist:  Dr Claiborne Billings Primary Electrophysiologist: Thompson Grayer, MD    CC: prior sudden death   History of Present Illness: Stephen Hays is a 50 y.o. male who presents today for electrophysiology follow-up.  Remains active.  Continues to smoke.  Today, he denies symptoms of palpitations, chest pain, shortness of breath, orthopnea, PND, lower extremity edema, claudication, dizziness, presyncope, syncope, bleeding, or neurologic sequela. The patient is tolerating medications without difficulties and is otherwise without complaint today.    Past Medical History:  Diagnosis Date  . CAD in native artery 12/2014   nonobstructive by cath  . Hypertension   . parotid mass   . Right ventricular dysplasia   . Tobacco abuse   . 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 Rayann Heman)   Past Surgical History:  Procedure Laterality Date  . BACK SURGERY    . CARDIAC CATHETERIZATION N/A 01/08/2015   Procedure: Left Heart Cath and Coronary Angiography;  Surgeon: Troy Sine, MD;  Location: [place] [facility];  Service: Cardiovascular;  Laterality: N/A;  . EP IMPLANTABLE DEVICE N/A 01/12/2015   Medtronic Visa AF MRI compatible ICD implanted by Dr Rayann Heman for secondary prevention of sudden death  . INCISION AND DRAINAGE     on back near buttocks  . PAROTIDECTOMY Right 01/08/2015   Procedure: ABORTED PAROTIDECTOMY;  Surgeon: Ruby Cola, MD;  Location: [facility] OR;  Service: ENT;  Laterality: Right;     Current Outpatient Prescriptions  Medication Sig Dispense Refill  . atorvastatin (LIPITOR) 80 MG tablet TAKE 1 TABLET (80 MG TOTAL) BY MOUTH DAILY AT 6 PM. 90 tablet 1  . ibuprofen (ADVIL,MOTRIN) 200 MG tablet Take 400 mg by mouth as needed.    . carvedilol (COREG) 6.25 MG tablet Take 1 tablet (6.25 mg total) by mouth 2 (two) times  daily. 60 tablet 6  . lisinopril (PRINIVIL,ZESTRIL) 20 MG tablet Take 1 tablet (20 mg total) by mouth daily. 30 tablet 6   No current facility-administered medications for this visit.     Allergies:   Doxycycline and Prednisone   Social History:  The patient  reports that he has been smoking.  He has a 30.00 pack-year smoking history. He has never used smokeless tobacco. He reports that he drinks about 3.6 - 4.2 oz of alcohol per week . He reports that he does not use drugs.   Family History:  The patient's family history includes COPD in his mother; Cancer - Lung in his father; Hypertension in his mother; Stroke in his brother and sister.    ROS:  Please see the history of present illness.   All other systems are reviewed and negative.    PHYSICAL EXAM: VS:  BP (!) 149/96 (BP Location: Left Arm, Cuff Size: Normal) Comment: rechecked due to difference between 2 arms   Pulse 95   Ht 5\' 10"  (1.778 m)   Wt 206 lb 6.4 oz (93.6 kg)   SpO2 100%   BMI 29.62 kg/m  , BMI Body mass index is 29.62 kg/m. GEN: Well nourished, well developed, in no acute distress  HEENT: normal  Neck: no JVD, carotid bruits, or masses Cardiac: RRR; no murmurs, rubs, or gallops,no edema  Respiratory:  clear to auscultation bilaterally, normal work of breathing GI: soft, nontender, nondistended, + BS  MS: no deformity or atrophy  Skin: warm and dry, device pocket is well healed Neuro:  Strength and sensation are intact Psych: euthymic mood, full affect  Device interrogation is reviewed today in detail.  See PaceArt for details.   Recent Labs: 01/08/2015: B Natriuretic Peptide 15.5 01/09/2015: ALT 249; Hemoglobin 14.2; Platelets 199 01/13/2015: BUN 10; Creatinine, Ser 0.79; Magnesium 1.9; Potassium 4.0; Sodium 134    Lipid Panel  No results found for: CHOL, TRIG, HDL, CHOLHDL, VLDL, LDLCALC, LDLDIRECT   Wt Readings from Last 3 Encounters:  10/29/15 206 lb 6.4 oz (93.6 kg)  04/30/15 210 lb 3.2 oz  (95.3 kg)  03/08/15 204 lb (92.5 kg)    MRI, cath, and echo reviewed with the patient today  ASSESSMENT AND PLAN:  1.  ARVD Doing well s/p ICD implantation Normal ICD function See Pace Art report No changes today He has declined genetic testing Will repeat echo to follow-up on ARVD. Needs to be on beta blocker and ace inhibitor to minimize further remodeling associated with ARVD  2. CAD Nonobstructive Continue atorvastatin  3. Tobacco Cessation advised He is not willing to quit  4. HTN He appears tachycardic and dehydrated today.  I suspect due to chlorthalidone. Needs to be on beta blocker and ace inhibitor to minimize further remodeling associated with ARVD.  I will therefore stop edarby/chlorthalidone and restart lisinopril and coreg.  Titrate coreg as able  5. Erectile dysfunction Did not improve off of beta blocker previously Can use viagra or cialis if needed.  Follow-up: return to see me in 6 months  Current medicines are reviewed at length with the patient today.   The patient does not have concerns regarding his medicines.  The following changes were made today:  none   Signed, Thompson Grayer, MD  10/31/2015 9:39 AM     [facility] [address] [place] [address] [phone] (office) [phone] (fax)

## 2015-11-02 DIAGNOSIS — I10 Essential (primary) hypertension: Secondary | ICD-10-CM | POA: Diagnosis not present

## 2015-11-02 DIAGNOSIS — Z720 Tobacco use: Secondary | ICD-10-CM | POA: Diagnosis not present

## 2015-11-02 DIAGNOSIS — E782 Mixed hyperlipidemia: Secondary | ICD-10-CM | POA: Diagnosis not present

## 2015-11-02 DIAGNOSIS — D110 Benign neoplasm of parotid gland: Secondary | ICD-10-CM | POA: Diagnosis not present

## 2015-11-12 ENCOUNTER — Encounter (HOSPITAL_COMMUNITY): Payer: Self-pay

## 2015-11-12 ENCOUNTER — Emergency Department (HOSPITAL_COMMUNITY): Payer: BLUE CROSS/BLUE SHIELD

## 2015-11-12 ENCOUNTER — Emergency Department (HOSPITAL_COMMUNITY)
Admission: EM | Admit: 2015-11-12 | Discharge: 2015-11-12 | Disposition: A | Discharge status: Home / Self Care | Payer: BLUE CROSS/BLUE SHIELD | Attending: Emergency Medicine | Admitting: Emergency Medicine

## 2015-11-12 DIAGNOSIS — I2510 Atherosclerotic heart disease of native coronary artery without angina pectoris: Secondary | ICD-10-CM | POA: Diagnosis not present

## 2015-11-12 DIAGNOSIS — Z23 Encounter for immunization: Secondary | ICD-10-CM | POA: Diagnosis not present

## 2015-11-12 DIAGNOSIS — S93402A Sprain of unspecified ligament of left ankle, initial encounter: Secondary | ICD-10-CM | POA: Diagnosis not present

## 2015-11-12 DIAGNOSIS — Y9269 Other specified industrial and construction area as the place of occurrence of the external cause: Secondary | ICD-10-CM | POA: Insufficient documentation

## 2015-11-12 DIAGNOSIS — S80812A Abrasion, left lower leg, initial encounter: Secondary | ICD-10-CM

## 2015-11-12 DIAGNOSIS — I10 Essential (primary) hypertension: Secondary | ICD-10-CM | POA: Insufficient documentation

## 2015-11-12 DIAGNOSIS — Y9389 Activity, other specified: Secondary | ICD-10-CM | POA: Insufficient documentation

## 2015-11-12 DIAGNOSIS — T148XXA Other injury of unspecified body region, initial encounter: Secondary | ICD-10-CM

## 2015-11-12 DIAGNOSIS — W01198A Fall on same level from slipping, tripping and stumbling with subsequent striking against other object, initial encounter: Secondary | ICD-10-CM | POA: Diagnosis not present

## 2015-11-12 DIAGNOSIS — F17200 Nicotine dependence, unspecified, uncomplicated: Secondary | ICD-10-CM | POA: Diagnosis not present

## 2015-11-12 DIAGNOSIS — S8992XA Unspecified injury of left lower leg, initial encounter: Secondary | ICD-10-CM | POA: Diagnosis not present

## 2015-11-12 DIAGNOSIS — M79605 Pain in left leg: Secondary | ICD-10-CM | POA: Diagnosis present

## 2015-11-12 DIAGNOSIS — L089 Local infection of the skin and subcutaneous tissue, unspecified: Secondary | ICD-10-CM | POA: Insufficient documentation

## 2015-11-12 DIAGNOSIS — Y990 Civilian activity done for income or pay: Secondary | ICD-10-CM | POA: Insufficient documentation

## 2015-11-12 MED ORDER — NAPROXEN 250 MG PO TABS
500.0000 mg | ORAL_TABLET | Freq: Once | ORAL | Status: DC
  Filled 2015-11-12: qty 2

## 2015-11-12 MED ORDER — SULFAMETHOXAZOLE-TRIMETHOPRIM 800-160 MG PO TABS: 1.0000 | ORAL_TABLET | Freq: Two times a day (BID) | ORAL | 0 refills | Status: DC

## 2015-11-12 MED ORDER — TETANUS-DIPHTH-ACELL PERTUSSIS 5-2.5-18.5 LF-MCG/0.5 IM SUSP
0.5000 mL | Freq: Once | INTRAMUSCULAR | Status: AC
  Administered 2015-11-12: 0.5 mL via INTRAMUSCULAR
  Filled 2015-11-12: qty 0.5

## 2015-11-12 MED ORDER — ACETAMINOPHEN 500 MG PO TABS
1000.0000 mg | ORAL_TABLET | Freq: Once | ORAL | Status: AC
  Administered 2015-11-12: 1000 mg via ORAL
  Filled 2015-11-12: qty 2

## 2015-11-12 MED ORDER — SULFAMETHOXAZOLE-TRIMETHOPRIM 800-160 MG PO TABS
1.0000 | ORAL_TABLET | Freq: Once | ORAL | Status: AC
  Administered 2015-11-12: 1 via ORAL
  Filled 2015-11-12: qty 1

## 2015-11-12 MED ORDER — BACITRACIN ZINC 500 UNIT/GM EX OINT
TOPICAL_OINTMENT | CUTANEOUS | Status: AC
  Filled 2015-11-12: qty 1.8

## 2015-11-12 MED ORDER — NAPROXEN 500 MG PO TABS: ORAL_TABLET | ORAL | 0 refills | Status: DC

## 2015-11-12 NOTE — ED Provider Notes (Addendum)
[facility] DEPT Provider Note   CSN: [id number] Arrival date & time: 11/12/15  0223  Time seen 02:40 AM  History   Chief Complaint Chief Complaint  Patient presents with  . Leg Pain    injury    HPI Stephen Hays is a 50 y.o. male.  HPI patient states August 14 he slipped on a wet trailer and he had a superficial scrape on his left lower leg. He states it did not even bleed. Since then he's been cleaning it daily with peroxide and putting a triple antibiotic ointment on the wound. He complains of pain in his left ankle since he fell and getting worsening swelling of his ankle. He states it hurts when he walks or bears weight on his ankle. He states today the wound got more red. He denies any fever, chills, nausea or vomiting. He states his last test that shot was 14 years ago   PCP Dr Merlyn Albert  Past Medical History:  Diagnosis Date  . CAD in native artery 12/2014   nonobstructive by cath  . Hypertension   . parotid mass   . Right ventricular dysplasia   . Tobacco abuse   . 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 Rayann Heman)    Patient Active Problem List   Diagnosis Date Noted  . Right ventricular dysplasia 04/30/2015  . Ventricular fibrillation ([place]) 01/11/2015  . CAD (coronary artery disease) 01/11/2015  . Mild left ventricular systolic dysfunction [id number]  . Hypomagnesemia 01/11/2015  . Right bundle branch block 01/11/2015  . Parotid mass 01/08/2015  . Cardiac arrest ([place])   . Hypotension   . Hyponatremia   . Tobacco use disorder   . Alcohol use ([place])   . Essential hypertension     Past Surgical History:  Procedure Laterality Date  . BACK SURGERY    . CARDIAC CATHETERIZATION N/A 01/08/2015   Procedure: Left Heart Cath and Coronary Angiography;  Surgeon: Troy Sine, MD;  Location: [facility];  Service: Cardiovascular;  Laterality: N/A;  . EP IMPLANTABLE DEVICE N/A 01/12/2015   Medtronic Visa AF MRI  compatible ICD implanted by Dr Rayann Heman for secondary prevention of sudden death  . INCISION AND DRAINAGE     on back near buttocks  . PAROTIDECTOMY Right 01/08/2015   Procedure: ABORTED PAROTIDECTOMY;  Surgeon: Ruby Cola, MD;  Location: [facility] OR;  Service: ENT;  Laterality: Right;       Home Medications    Prior to Admission medications   Medication Sig Start Date End Date Taking? Authorizing Provider  atorvastatin (LIPITOR) 80 MG tablet TAKE 1 TABLET (80 MG TOTAL) BY MOUTH DAILY AT 6 PM. 10/04/15  Yes Troy Sine, MD  carvedilol (COREG) 6.25 MG tablet Take 1 tablet (6.25 mg total) by mouth 2 (two) times daily. 10/29/15  Yes Thompson Grayer, MD  ibuprofen (ADVIL,MOTRIN) 200 MG tablet Take 400 mg by mouth as needed.   Yes Historical Provider, MD  lisinopril (PRINIVIL,ZESTRIL) 20 MG tablet Take 1 tablet (20 mg total) by mouth daily. 10/29/15 11/28/15 Yes Thompson Grayer, MD  naproxen (NAPROSYN) 500 MG tablet Take 1 po BID with food prn pain 11/12/15   Rolland Porter, MD  sulfamethoxazole-trimethoprim (BACTRIM DS,SEPTRA DS) 800-160 MG tablet Take 1 tablet by mouth 2 (two) times daily. 11/12/15   Rolland Porter, MD    Family History Family History  Problem Relation Age of Onset  . Hypertension Mother   . COPD  Mother   . Cancer - Lung Father   . Stroke Sister   . Stroke Brother     Social History Social History  Substance Use Topics  . Smoking status: Current Every Day Smoker    Packs/day: 1.00    Years: 30.00  . Smokeless tobacco: Never Used     Comment: trying cut back  . Alcohol use 3.6 - 4.2 oz/week    6 - 7 Cans of beer per week     Comment: social  employed Lives with spouse   Allergies   Doxycycline and Prednisone   Review of Systems Review of Systems  All other systems reviewed and are negative.    Physical Exam Updated Vital Signs BP (!) 160/113 (BP Location: Right Arm)   Pulse 75   Temp 98.1 F (36.7 C) (Oral)   Resp 18   Ht 5\' 10"  (1.778 m)   Wt 205 lb (93 kg)   SpO2  100%   BMI 29.41 kg/m   Vital signs normal except for hypertension   Physical Exam  Constitutional: He is oriented to person, place, and time. He appears well-developed and well-nourished.  Non-toxic appearance. He does not appear ill. No distress.  HENT:  Head: Normocephalic and atraumatic.  Right Ear: External ear normal.  Left Ear: External ear normal.  Nose: Nose normal. No mucosal edema or rhinorrhea.  Mouth/Throat: Mucous membranes are normal. No dental abscesses or uvula swelling.  Eyes: Conjunctivae and EOM are normal.  Neck: Normal range of motion and full passive range of motion without pain.  Cardiovascular: Normal rate.   Pulmonary/Chest: Effort normal. No respiratory distress. He has no rhonchi. He exhibits no crepitus.  Abdominal: Normal appearance. There is no rebound.  Musculoskeletal: Normal range of motion. He exhibits edema and tenderness.       Feet:  Moves all extremities well. Patient is noted to have a long linear abrasion on his anterior left shin with some redness around the margins. He has some swelling and redness of the skin of the distal lower leg and especially over the ankle. He is tender to palpation over the lateral malleolus. He has good distal pulses and capillary refill.  Neurological: He is alert and oriented to person, place, and time. He has normal strength. No cranial nerve deficit.  Skin: Skin is warm, dry and intact. No rash noted. No erythema. No pallor.  Psychiatric: He has a normal mood and affect. His speech is normal and behavior is normal. His mood appears not anxious.  Nursing note and vitals reviewed.      ED Treatments / Results   Radiology Dg Ankle Complete Left  Result Date: 11/12/2015 CLINICAL DATA:  [place] off trailer, with injury to left lower leg. Initial encounter. EXAM: LEFT ANKLE COMPLETE - 3+ VIEW COMPARISON:  None. FINDINGS: There is no evidence of fracture or dislocation. The ankle mortise is intact; the interosseous  space is within normal limits. No talar tilt or subluxation is seen. A plantar calcaneal spur is noted. The joint spaces are preserved. No significant soft tissue abnormalities are seen. IMPRESSION: No evidence of fracture or dislocation. Electronically Signed   By: Garald Balding M.D.   On: 11/12/2015 03:13    Procedures Procedures (including critical care time)  Medications Ordered in ED Medications  naproxen (NAPROSYN) tablet 500 mg (not administered)  sulfamethoxazole-trimethoprim (BACTRIM DS,SEPTRA DS) 800-160 MG per tablet 1 tablet (not administered)  Tdap (BOOSTRIX) injection 0.5 mL (0.5 mLs Intramuscular Given 11/12/15 0251)  acetaminophen (TYLENOL) tablet 1,000 mg (1,000 mg Oral Given 11/12/15 0306)     Initial Impression / Assessment and Plan / ED Course  I have reviewed the triage vital signs and the nursing notes.  Pertinent labs & imaging results that were available during my care of the patient were reviewed by me and considered in my medical decision making (see chart for details).  Clinical Course  X-ray was ordered of patient's ankle since that is where he is. His tetanus was updated. He was given acetaminophen for pain.  3:30 AM patient was given the results of his x-ray. We discussed putting him on antibiotic and to stop using the peroxide on his wound. We also discussed to make sure his antibiotic ointment does not have neomycin in it because that can cause redness in the wound. He was given naproxen for pain to take with acetaminophen. He was placed in a ASO for his ankle sprain on the left. He was referred to orthopedics if he's not improving in the next week. We discussed worrisome signs of a wound infection to return to the ED.  Patient's last laboratory testing in October shows BUN 10, creatinine 0.79. Therefore he was sent home on anti-inflammatory.  Final Clinical Impressions(s) / ED Diagnoses   Final diagnoses:  Sprained ankle, left, initial encounter    Abrasion, lower leg, anterior, left, initial encounter  Wound infection (HCC)    New Prescriptions New Prescriptions   NAPROXEN (NAPROSYN) 500 MG TABLET    Take 1 po BID with food prn pain   SULFAMETHOXAZOLE-TRIMETHOPRIM (BACTRIM DS,SEPTRA DS) 800-160 MG TABLET    Take 1 tablet by mouth 2 (two) times daily.    Plan discharge  Rolland Porter, MD, Barbette Or, MD 11/12/15 [place], MD 11/12/15 [phone]

## 2015-11-12 NOTE — Discharge Instructions (Signed)
Elevate your legs. You can soak your leg in warm Epsom salts. Use ice packs over your ankle to help with the pain and swelling. Take the antibiotic until gone. Take the naproxen with acetaminophen 1000 mg 4 times a day for pain. If your ankle continues to hurt after the next week you can be rechecked by the orthopedist on call, Dr. Aline Brochure. Call his office to get an appointment. If he started getting fever, chills, nausea or vomiting, or your wound gets more red, swollen, or starts draining pus, you should be rechecked. Stop using peroxide on your wound, it will delay healing. Also make sure the triple antibiotic ointment does not have neomycin in it, some people develop an allergy to it and it will make your wound get more red and painful.

## 2015-11-12 NOTE — ED Triage Notes (Signed)
Pt injured his left ankle/foot/leg when he slipped on a wet trailer while working, scraped his shin on same, states pain and redness has increased to the area.

## 2015-11-25 ENCOUNTER — Other Ambulatory Visit: Payer: Self-pay

## 2015-11-25 ENCOUNTER — Ambulatory Visit (INDEPENDENT_AMBULATORY_CARE_PROVIDER_SITE_OTHER): Payer: BLUE CROSS/BLUE SHIELD

## 2015-11-25 ENCOUNTER — Encounter: Payer: Self-pay | Admitting: Internal Medicine

## 2015-11-25 DIAGNOSIS — I428 Other cardiomyopathies: Secondary | ICD-10-CM | POA: Diagnosis not present

## 2015-12-01 ENCOUNTER — Telehealth: Payer: Self-pay | Admitting: *Deleted

## 2015-12-01 NOTE — Telephone Encounter (Signed)
Notes Recorded by Laurine Blazer, LPN on 075-GRM at [id number] AM EDT Left message to return call.  ------  Notes Recorded by Thompson Grayer, MD on 11/25/2015 at 2:11 PM EDT Results reviewed. Claiborne Billings, please inform pt of result. I will route to primary care also.

## 2015-12-01 NOTE — Telephone Encounter (Signed)
Notes Recorded by Laurine Blazer, LPN on 075-GRM at 624THL PM EDT Patient notified and verbalized understanding. Copy to pmd.

## 2016-01-03 ENCOUNTER — Telehealth: Payer: Self-pay | Admitting: Internal Medicine

## 2016-01-03 NOTE — Telephone Encounter (Signed)
I spoke with patient. He states his BP has been high since the change to lisinopril 20 mg in August. He reports his BP ranges from 140-170/105-110 daily. He denies headache, blurred vision, SOB, or CP. I had patient check BP while on call with me, reports 169/108 with HR of 67 bpm. He states he does not eat "salt" on his food. He does admit to eating a lot of canned vegetables, lunch meat, and drinking soda. He states his PCP recommended he increase his dose of lisinopril, but he did not want to do that unless you recommended it.  I counseled patient on diet and hydration. Please advise if you would like to increase lisinopril. Thanks!

## 2016-01-03 NOTE — Telephone Encounter (Signed)
New Message:     Pt says his new blood pressure medicine is not working,blood pressure is still high. He started taking this in August,its not helping.

## 2016-01-05 NOTE — Telephone Encounter (Signed)
Dr Rayann Heman advised patient to increase as per PCP and follow up with PCP for recheck.  I spoke with patient's wife and then called and left him a message on same number which is his cell with Dr Jackalyn Lombard recommendations.

## 2016-01-17 ENCOUNTER — Other Ambulatory Visit: Payer: Self-pay | Admitting: *Deleted

## 2016-01-17 MED ORDER — LISINOPRIL 20 MG PO TABS: 20.0000 mg | ORAL_TABLET | Freq: Every day | ORAL | 6 refills | Status: DC

## 2016-01-18 ENCOUNTER — Other Ambulatory Visit: Payer: Self-pay | Admitting: *Deleted

## 2016-01-18 MED ORDER — LISINOPRIL 20 MG PO TABS: 20.0000 mg | ORAL_TABLET | Freq: Every day | ORAL | 3 refills | Status: DC

## 2016-01-19 ENCOUNTER — Other Ambulatory Visit: Payer: Self-pay | Admitting: *Deleted

## 2016-01-19 MED ORDER — CARVEDILOL 6.25 MG PO TABS
6.2500 mg | ORAL_TABLET | Freq: Two times a day (BID) | ORAL | 3 refills | Status: DC
Start: 2016-01-19 — End: 2017-01-31

## 2016-01-31 ENCOUNTER — Telehealth: Payer: Self-pay | Admitting: Cardiology

## 2016-01-31 ENCOUNTER — Ambulatory Visit (INDEPENDENT_AMBULATORY_CARE_PROVIDER_SITE_OTHER): Payer: BLUE CROSS/BLUE SHIELD | Admitting: *Deleted

## 2016-01-31 DIAGNOSIS — I4901 Ventricular fibrillation: Secondary | ICD-10-CM | POA: Diagnosis not present

## 2016-01-31 DIAGNOSIS — I10 Essential (primary) hypertension: Secondary | ICD-10-CM | POA: Diagnosis not present

## 2016-01-31 DIAGNOSIS — M79671 Pain in right foot: Secondary | ICD-10-CM | POA: Diagnosis not present

## 2016-01-31 DIAGNOSIS — Z720 Tobacco use: Secondary | ICD-10-CM | POA: Diagnosis not present

## 2016-01-31 DIAGNOSIS — Z6831 Body mass index (BMI) 31.0-31.9, adult: Secondary | ICD-10-CM | POA: Diagnosis not present

## 2016-01-31 NOTE — Progress Notes (Signed)
Remote ICD transmission.   

## 2016-01-31 NOTE — Telephone Encounter (Signed)
Spoke with pt and reminded pt of remote transmission that is due today. Pt verbalized understanding.   

## 2016-02-02 ENCOUNTER — Encounter: Payer: Self-pay | Admitting: Cardiology

## 2016-02-13 LAB — CUP PACEART REMOTE DEVICE CHECK
Battery Remaining Longevity: 131 mo
Battery Voltage: 3.01 V
Date Time Interrogation Session: 20171106201601
HIGH POWER IMPEDANCE MEASURED VALUE: 73 Ohm
Implantable Lead Implant Date: 20161018
Lead Channel Impedance Value: 285 Ohm
Lead Channel Pacing Threshold Amplitude: 1.5 V
Lead Channel Sensing Intrinsic Amplitude: 7.25 mV
Lead Channel Setting Pacing Amplitude: 3 V
Lead Channel Setting Pacing Pulse Width: 0.4 ms
MDC IDC LEAD LOCATION: 753860
MDC IDC MSMT LEADCHNL RV IMPEDANCE VALUE: 399 Ohm
MDC IDC MSMT LEADCHNL RV PACING THRESHOLD PULSEWIDTH: 0.4 ms
MDC IDC MSMT LEADCHNL RV SENSING INTR AMPL: 7.25 mV
MDC IDC PG IMPLANT DT: 20161018
MDC IDC SET LEADCHNL RV SENSING SENSITIVITY: 0.3 mV
MDC IDC STAT BRADY RV PERCENT PACED: 0.01 %

## 2016-04-28 ENCOUNTER — Encounter: Payer: BLUE CROSS/BLUE SHIELD | Admitting: Internal Medicine

## 2016-05-02 DIAGNOSIS — R05 Cough: Secondary | ICD-10-CM | POA: Diagnosis not present

## 2016-05-02 DIAGNOSIS — J060 Acute laryngopharyngitis: Secondary | ICD-10-CM | POA: Diagnosis not present

## 2016-06-02 ENCOUNTER — Encounter: Payer: BLUE CROSS/BLUE SHIELD | Admitting: Internal Medicine

## 2016-07-07 ENCOUNTER — Encounter: Payer: Self-pay | Admitting: Internal Medicine

## 2016-07-07 ENCOUNTER — Ambulatory Visit (INDEPENDENT_AMBULATORY_CARE_PROVIDER_SITE_OTHER): Payer: BLUE CROSS/BLUE SHIELD | Admitting: Internal Medicine

## 2016-07-07 VITALS — BP 215/143 | HR 70 | Ht 70.0 in | Wt 221.4 lb

## 2016-07-07 DIAGNOSIS — I10 Essential (primary) hypertension: Secondary | ICD-10-CM | POA: Diagnosis not present

## 2016-07-07 DIAGNOSIS — I428 Other cardiomyopathies: Secondary | ICD-10-CM | POA: Diagnosis not present

## 2016-07-07 MED ORDER — AMLODIPINE BESYLATE 5 MG PO TABS: 5.0000 mg | ORAL_TABLET | Freq: Every day | ORAL | 6 refills | Status: DC

## 2016-07-07 MED ORDER — ATORVASTATIN CALCIUM 80 MG PO TABS: 80.0000 mg | ORAL_TABLET | Freq: Every day | ORAL | 3 refills | Status: DC

## 2016-07-07 NOTE — Progress Notes (Signed)
Electrophysiology Office Note   Date:  07/07/2016   ID:  Hays, Stephen Sep 10, 1965, MRN [id number]  PCP:  Wende Neighbors, MD  Cardiologist:  Dr Claiborne Billings Primary Electrophysiologist: Thompson Grayer, MD    CC: elevated BP   History of Present Illness: Stephen Hays is a 51 y.o. male who presents today for electrophysiology follow-up.  Remains active.  Continues to smoke.  Has noticed significantly elevated BP recently.  Denies HA, blurred vision, or neuro symptoms.  Today, he denies symptoms of palpitations, chest pain, shortness of breath, orthopnea, PND, lower extremity edema, claudication, dizziness, presyncope, syncope, or bleeding.. The patient is tolerating medications without difficulties and is otherwise without complaint today.    Past Medical History:  Diagnosis Date  . CAD in native artery 12/2014   nonobstructive by cath  . Hypertension   . parotid mass   . Right ventricular dysplasia   . Tobacco abuse   . 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 Rayann Heman)   Past Surgical History:  Procedure Laterality Date  . BACK SURGERY    . CARDIAC CATHETERIZATION N/A 01/08/2015   Procedure: Left Heart Cath and Coronary Angiography;  Surgeon: Troy Sine, MD;  Location: [facility];  Service: Cardiovascular;  Laterality: N/A;  . EP IMPLANTABLE DEVICE N/A 01/12/2015   Medtronic Visa AF MRI compatible ICD implanted by Dr Rayann Heman for secondary prevention of sudden death  . INCISION AND DRAINAGE     on back near buttocks  . PAROTIDECTOMY Right 01/08/2015   Procedure: ABORTED PAROTIDECTOMY;  Surgeon: Ruby Cola, MD;  Location: [facility] OR;  Service: ENT;  Laterality: Right;     Current Outpatient Prescriptions  Medication Sig Dispense Refill  . atorvastatin (LIPITOR) 80 MG tablet TAKE 1 TABLET (80 MG TOTAL) BY MOUTH DAILY AT 6 PM. 90 tablet 1  . carvedilol (COREG) 6.25 MG tablet Take 1 tablet (6.25 mg total) by mouth 2 (two) times daily.  180 tablet 3  . cloNIDine (CATAPRES) 0.1 MG tablet Take 0.1 mg by mouth 2 (two) times daily.    [name] Kitchen ibuprofen (ADVIL,MOTRIN) 200 MG tablet Take 400 mg by mouth as needed.    [name] Kitchen lisinopril (PRINIVIL,ZESTRIL) 20 MG tablet Take 20 mg by mouth daily.    . naproxen (NAPROSYN) 500 MG tablet Take 1 po BID with food prn pain 30 tablet 0   No current facility-administered medications for this visit.     Allergies:   Doxycycline and Prednisone   Social History:  The patient  reports that he has been smoking.  He has a 30.00 pack-year smoking history. He has never used smokeless tobacco. He reports that he drinks about 3.6 - 4.2 oz of alcohol per week . He reports that he does not use drugs.   Family History:  The patient's family history includes COPD in his mother; Cancer - Lung in his father; Hypertension in his mother; Stroke in his brother and sister.    ROS:  Please see the history of present illness.   All other systems are reviewed and negative.    PHYSICAL EXAM: VS:  BP (!) 215/143 (BP Location: Right Arm, Cuff Size: Normal)   Pulse 70   Ht 5\' 10"  (1.778 m)   Wt 221 lb 6.4 oz (100.4 kg)   SpO2 100%   BMI 31.77 kg/m  , BMI Body mass index is 31.77 kg/m. GEN: Well nourished, well developed, in no acute distress  HEENT: normal  Neck: no JVD, carotid bruits, or masses Cardiac: RRR; no murmurs, rubs, or gallops,no edema  Respiratory:  clear to auscultation bilaterally, normal work of breathing GI: soft, nontender, nondistended, + BS MS: no deformity or atrophy  Skin: warm and dry, device pocket is well healed Neuro:  Strength and sensation are intact Psych: euthymic mood, full affect  Device interrogation is personally  reviewed today in detail.  See PaceArt for details.    Wt Readings from Last 3 Encounters:  07/07/16 221 lb 6.4 oz (100.4 kg)  11/12/15 205 lb (93 kg)  10/29/15 206 lb 6.4 oz (93.6 kg)     Echo 2017 is reviewed today  ASSESSMENT AND PLAN:  1.  ARVD Doing well  s/p ICD implantation Normal ICD function See Claudia Desanctis Art report No changes today Echo 2017 reveals preserved RV function Will repeat echo to follow-up on ARVD. Needs to be on beta blocker and ace inhibitor to minimize further remodeling associated with ARVD  2. CAD Nonobstructive Continue atorvastatin  3. Tobacco Cessation advised He is not willing to quit  4. HTN Very elevated today Add norvasc 5mg  daily.  He will increase to 10mg  daily if not controlled Then titrate coreg if needed  5. Erectile dysfunction Did not improve off of beta blocker previously Can use viagra or cialis if needed.  Follow-up: return to see me in 12 months carelink I have offered earlier BP follow-up.  He is clear that he prefers to follow with Dr Nevada Crane but will contact me if I can assist  Current medicines are reviewed at length with the patient today.   The patient does not have concerns regarding his medicines.  The following changes were made today:  none   Signed, Thompson Grayer, MD  07/07/2016 12:32 PM     [place] [address] [place] [address] [phone] (office) [phone] (fax)

## 2016-07-07 NOTE — Patient Instructions (Addendum)
Medication Instructions:   Begin Norvasc 5mg  daily.  If blood pressure still elevated (>140/90), then increase to 10mg  daily.  Continue all other medications.    Labwork: none  Testing/Procedures: none  Follow-Up: Your physician wants you to follow up in:  1 year.  You will receive a reminder letter in the mail one-two months in advance.  If you don't receive a letter, please call our office to schedule the follow up appointment - Dr. Rayann Heman.   Any Other Special Instructions Will Be Listed Below (If Applicable). Remote monitoring is used to monitor your Pacemaker of ICD from home. This monitoring reduces the number of office visits required to check your device to one time per year. It allows [place] to keep an eye on the functioning of your device to ensure it is working properly. You are scheduled for a device check from home on 10/09/2016. You may send your transmission at any time that day. If you have a wireless device, the transmission will be sent automatically. After your physician reviews your transmission, you will receive a postcard with your next transmission date.  If you need a refill on your cardiac medications before your next appointment, please call your pharmacy.

## 2016-07-11 LAB — CUP PACEART INCLINIC DEVICE CHECK
Battery Remaining Longevity: 128 mo
Battery Voltage: 3.02 V
Brady Statistic RV Percent Paced: 0.01 %
Date Time Interrogation Session: 20180413163427
HighPow Impedance: 68 Ohm
Lead Channel Impedance Value: 399 Ohm
Lead Channel Sensing Intrinsic Amplitude: 11.375 mV
Lead Channel Setting Pacing Amplitude: 2.5 V
Lead Channel Setting Pacing Pulse Width: 0.4 ms
MDC IDC LEAD IMPLANT DT: 20161018
MDC IDC LEAD LOCATION: 753860
MDC IDC MSMT LEADCHNL RV IMPEDANCE VALUE: 304 Ohm
MDC IDC MSMT LEADCHNL RV PACING THRESHOLD AMPLITUDE: 1.375 V
MDC IDC MSMT LEADCHNL RV PACING THRESHOLD PULSEWIDTH: 0.4 ms
MDC IDC MSMT LEADCHNL RV SENSING INTR AMPL: 9.875 mV
MDC IDC PG IMPLANT DT: 20161018
MDC IDC SET LEADCHNL RV SENSING SENSITIVITY: 0.3 mV

## 2016-07-13 ENCOUNTER — Telehealth: Payer: Self-pay | Admitting: Internal Medicine

## 2016-07-13 NOTE — Telephone Encounter (Signed)
Stephen Hays called stating that he went to [facility] to pick up (NORVASC) 5 MG tablet   and they did not have this prescription.   Please call patient at work # [phone] ([employer]) and ask for [employer].

## 2016-07-13 NOTE — Telephone Encounter (Signed)
Contacted pharmacy. Pharmacy stated medication would be ready in a little bit. They did have prescription. Patient notified

## 2016-10-09 ENCOUNTER — Encounter: Payer: BLUE CROSS/BLUE SHIELD | Admitting: *Deleted

## 2016-10-09 ENCOUNTER — Telehealth: Payer: Self-pay | Admitting: Cardiology

## 2016-10-09 NOTE — Telephone Encounter (Signed)
Confirmed remote transmission w/ pt.   

## 2016-11-28 ENCOUNTER — Ambulatory Visit (INDEPENDENT_AMBULATORY_CARE_PROVIDER_SITE_OTHER): Payer: BLUE CROSS/BLUE SHIELD | Admitting: *Deleted

## 2016-11-28 DIAGNOSIS — I469 Cardiac arrest, cause unspecified: Secondary | ICD-10-CM

## 2016-11-28 DIAGNOSIS — I4901 Ventricular fibrillation: Secondary | ICD-10-CM

## 2016-11-30 NOTE — Progress Notes (Signed)
Remote ICD transmission.   

## 2016-12-06 ENCOUNTER — Encounter: Payer: Self-pay | Admitting: Cardiology

## 2016-12-08 LAB — CUP PACEART REMOTE DEVICE CHECK
Battery Remaining Longevity: 125 mo
Battery Voltage: 3.01 V
Brady Statistic RV Percent Paced: 0.01 %
HighPow Impedance: 74 Ohm
Implantable Lead Location: 753860
Lead Channel Impedance Value: 304 Ohm
Lead Channel Impedance Value: 361 Ohm
Lead Channel Setting Pacing Amplitude: 3 V
Lead Channel Setting Pacing Pulse Width: 0.4 ms
Lead Channel Setting Sensing Sensitivity: 0.3 mV
MDC IDC LEAD IMPLANT DT: 20161018
MDC IDC MSMT LEADCHNL RV PACING THRESHOLD AMPLITUDE: 1.5 V
MDC IDC MSMT LEADCHNL RV PACING THRESHOLD PULSEWIDTH: 0.4 ms
MDC IDC MSMT LEADCHNL RV SENSING INTR AMPL: 11.25 mV
MDC IDC MSMT LEADCHNL RV SENSING INTR AMPL: 11.25 mV
MDC IDC PG IMPLANT DT: 20161018
MDC IDC SESS DTM: 20180902012443

## 2016-12-20 DIAGNOSIS — Z720 Tobacco use: Secondary | ICD-10-CM | POA: Diagnosis not present

## 2016-12-20 DIAGNOSIS — I10 Essential (primary) hypertension: Secondary | ICD-10-CM | POA: Diagnosis not present

## 2016-12-20 DIAGNOSIS — Z6831 Body mass index (BMI) 31.0-31.9, adult: Secondary | ICD-10-CM | POA: Diagnosis not present

## 2016-12-20 DIAGNOSIS — R1010 Upper abdominal pain, unspecified: Secondary | ICD-10-CM | POA: Diagnosis not present

## 2017-01-18 DIAGNOSIS — E291 Testicular hypofunction: Secondary | ICD-10-CM | POA: Diagnosis not present

## 2017-01-18 DIAGNOSIS — D110 Benign neoplasm of parotid gland: Secondary | ICD-10-CM | POA: Diagnosis not present

## 2017-01-18 DIAGNOSIS — R7301 Impaired fasting glucose: Secondary | ICD-10-CM | POA: Diagnosis not present

## 2017-01-18 DIAGNOSIS — I10 Essential (primary) hypertension: Secondary | ICD-10-CM | POA: Diagnosis not present

## 2017-01-21 ENCOUNTER — Other Ambulatory Visit: Payer: Self-pay | Admitting: Internal Medicine

## 2017-01-24 DIAGNOSIS — R945 Abnormal results of liver function studies: Secondary | ICD-10-CM | POA: Diagnosis not present

## 2017-01-24 DIAGNOSIS — R7301 Impaired fasting glucose: Secondary | ICD-10-CM | POA: Diagnosis not present

## 2017-01-24 DIAGNOSIS — E782 Mixed hyperlipidemia: Secondary | ICD-10-CM | POA: Diagnosis not present

## 2017-01-24 DIAGNOSIS — F17210 Nicotine dependence, cigarettes, uncomplicated: Secondary | ICD-10-CM | POA: Diagnosis not present

## 2017-01-31 ENCOUNTER — Other Ambulatory Visit: Payer: Self-pay | Admitting: Internal Medicine

## 2017-02-03 ENCOUNTER — Emergency Department (HOSPITAL_COMMUNITY)
Admission: EM | Admit: 2017-02-03 | Discharge: 2017-02-03 | Disposition: A | Discharge status: Home / Self Care | Payer: BLUE CROSS/BLUE SHIELD | Attending: Emergency Medicine | Admitting: Emergency Medicine

## 2017-02-03 ENCOUNTER — Emergency Department (HOSPITAL_COMMUNITY): Payer: BLUE CROSS/BLUE SHIELD

## 2017-02-03 ENCOUNTER — Other Ambulatory Visit: Payer: Self-pay

## 2017-02-03 ENCOUNTER — Encounter (HOSPITAL_COMMUNITY): Payer: Self-pay | Admitting: Emergency Medicine

## 2017-02-03 DIAGNOSIS — F17200 Nicotine dependence, unspecified, uncomplicated: Secondary | ICD-10-CM | POA: Diagnosis not present

## 2017-02-03 DIAGNOSIS — I2510 Atherosclerotic heart disease of native coronary artery without angina pectoris: Secondary | ICD-10-CM | POA: Diagnosis not present

## 2017-02-03 DIAGNOSIS — R42 Dizziness and giddiness: Secondary | ICD-10-CM | POA: Diagnosis not present

## 2017-02-03 DIAGNOSIS — I10 Essential (primary) hypertension: Secondary | ICD-10-CM | POA: Insufficient documentation

## 2017-02-03 DIAGNOSIS — Z79899 Other long term (current) drug therapy: Secondary | ICD-10-CM | POA: Diagnosis not present

## 2017-02-03 DIAGNOSIS — R748 Abnormal levels of other serum enzymes: Secondary | ICD-10-CM

## 2017-02-03 LAB — CBC WITH DIFFERENTIAL/PLATELET
Basophils Absolute: 0.1 10*3/uL (ref 0.0–0.1)
Basophils Relative: 1 %
Eosinophils Absolute: 0.3 10*3/uL (ref 0.0–0.7)
Eosinophils Relative: 4 %
HCT: 47.2 % (ref 39.0–52.0)
HEMOGLOBIN: 16.4 g/dL (ref 13.0–17.0)
Lymphocytes Relative: 40 %
Lymphs Abs: 3.1 10*3/uL (ref 0.7–4.0)
MCH: 33.7 pg (ref 26.0–34.0)
MCHC: 34.7 g/dL (ref 30.0–36.0)
MCV: 96.9 fL (ref 78.0–100.0)
MONOS PCT: 8 %
Monocytes Absolute: 0.6 10*3/uL (ref 0.1–1.0)
NEUTROS PCT: 47 %
Neutro Abs: 3.6 10*3/uL (ref 1.7–7.7)
Platelets: 156 10*3/uL (ref 150–400)
RBC: 4.87 MIL/uL (ref 4.22–5.81)
RDW: 12.4 % (ref 11.5–15.5)
WBC: 7.6 10*3/uL (ref 4.0–10.5)

## 2017-02-03 LAB — COMPREHENSIVE METABOLIC PANEL
ALK PHOS: 54 U/L (ref 38–126)
ALT: 66 U/L — ABNORMAL HIGH (ref 17–63)
ANION GAP: 14 (ref 5–15)
AST: 66 U/L — ABNORMAL HIGH (ref 15–41)
Albumin: 4.6 g/dL (ref 3.5–5.0)
BUN: 7 mg/dL (ref 6–20)
CALCIUM: 9.3 mg/dL (ref 8.9–10.3)
CO2: 18 mmol/L — ABNORMAL LOW (ref 22–32)
Chloride: 103 mmol/L (ref 101–111)
Creatinine, Ser: 0.73 mg/dL (ref 0.61–1.24)
GFR calc Af Amer: >60 mL/min (ref >60)
GFR calc non Af Amer: >60 mL/min (ref >60)
GLUCOSE: 128 mg/dL — AB (ref 65–99)
POTASSIUM: 4 mmol/L (ref 3.5–5.1)
SODIUM: 135 mmol/L (ref 135–145)
Total Bilirubin: 0.9 mg/dL (ref 0.3–1.2)
Total Protein: 7.6 g/dL (ref 6.5–8.1)

## 2017-02-03 LAB — TROPONIN I: Troponin I: <0.03 ng/mL (ref <0.03)

## 2017-02-03 LAB — CBG MONITORING, ED: Glucose-Capillary: 126 mg/dL — ABNORMAL HIGH (ref 65–99)

## 2017-02-03 MED ORDER — SODIUM CHLORIDE 0.9 % IV BOLUS (SEPSIS)
1000.0000 mL | Freq: Once | INTRAVENOUS | Status: AC
  Administered 2017-02-03: 1000 mL via INTRAVENOUS

## 2017-02-03 NOTE — ED Provider Notes (Signed)
[facility] EMERGENCY DEPARTMENT Provider Note   CSN: [id number] Arrival date & time: 02/03/17  1324     History   Chief Complaint Chief Complaint  Patient presents with  . Dizziness    HPI Stephen Hays is a 51 y.o. male.  Level 5 caveat for acuity of condition.  Patient was standing around at his church when he felt dizzy and sweaty.  He had missed his blood pressure medicine yesterday and had just taken his normal dose just prior to admission. No chest pain, dyspnea, gross neurological deficits, facial asymmetry, speech impediment, arm or leg weakness. Patient has a defibrillator in place.  He is feeling much better in the emergency department.  He ate only one [employer]ese[employer] cup earlier today.  He smokes cigarettes and drinks alcohol.      Past Medical History:  Diagnosis Date  . CAD in native artery 12/2014   nonobstructive by cath  . Hypertension   . parotid mass   . Right ventricular dysplasia   . Tobacco abuse   . 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 Rayann Heman)    Patient Active Problem List   Diagnosis Date Noted  . Right ventricular dysplasia 04/30/2015  . Ventricular fibrillation ([place]) 01/11/2015  . CAD (coronary artery disease) 01/11/2015  . Mild left ventricular systolic dysfunction 46/96/2952  . Hypomagnesemia 01/11/2015  . Right bundle branch block 01/11/2015  . Parotid mass 01/08/2015  . Cardiac arrest ([place])   . Hypotension   . Hyponatremia   . Tobacco use disorder   . Alcohol use   . Essential hypertension     Past Surgical History:  Procedure Laterality Date  . BACK SURGERY    . INCISION AND DRAINAGE     on back near buttocks       Home Medications    Prior to Admission medications   Medication Sig Start Date End Date Taking? Authorizing Provider  amLODipine (NORVASC) 5 MG tablet Take 1 tablet (5 mg total) by mouth daily. 07/07/16 10/05/16  Allred, Jeneen Rinks, MD  atorvastatin (LIPITOR) 80 MG tablet Take  1 tablet (80 mg total) by mouth daily. 07/07/16   Allred, Jeneen Rinks, MD  carvedilol (COREG) 6.25 MG tablet TAKE 1 TABLET (6.25 MG TOTAL) BY MOUTH 2 (TWO) TIMES DAILY. 02/01/17   Allred, Jeneen Rinks, MD  cloNIDine (CATAPRES) 0.1 MG tablet Take 0.1 mg by mouth 2 (two) times daily.    [provider]  ibuprofen (ADVIL,MOTRIN) 200 MG tablet Take 400 mg by mouth as needed.    [provider]  lisinopril (PRINIVIL,ZESTRIL) 20 MG tablet Take 20 mg by mouth daily.    [provider]  lisinopril (PRINIVIL,ZESTRIL) 20 MG tablet TAKE 1 TABLET (20 MG TOTAL) BY MOUTH DAILY. 01/22/17 02/21/17  Allred, Jeneen Rinks, MD  naproxen (NAPROSYN) 500 MG tablet Take 1 po BID with food prn pain 11/12/15   Rolland Porter, MD    Family History Family History  Problem Relation Age of Onset  . Hypertension Mother   . COPD Mother   . Cancer - Lung Father   . Stroke Sister   . Stroke Brother     Social History Social History   Tobacco Use  . Smoking status: Current Every Day Smoker    Packs/day: 1.00    Years: 30.00    Pack years: 30.00  . Smokeless tobacco: Never Used  . Tobacco comment: trying cut back  Substance Use Topics  . Alcohol use:  Yes    Alcohol/week: 3.6 - 4.2 oz    Types: 6 - 7 Cans of beer per week    Comment: social  . Drug use: No     Allergies   Doxycycline and Prednisone   Review of Systems Review of Systems  Unable to perform ROS: Acuity of condition     Physical Exam Updated Vital Signs BP 126/80   Pulse (!) 58   Temp 97.6 F (36.4 C) (Oral)   Resp 19   Ht 5\' 10"  (1.778 m)   Wt 95.3 kg (210 lb)   SpO2 99%   BMI 30.13 kg/m   Physical Exam  Constitutional: He is oriented to person, place, and time. He appears well-developed and well-nourished.  HENT:  Head: Normocephalic and atraumatic.  Eyes: Conjunctivae are normal.  Neck: Neck supple.  Cardiovascular: Normal rate and regular rhythm.  Pulmonary/Chest: Effort normal and breath sounds normal.  Abdominal:  Soft. Bowel sounds are normal.  Musculoskeletal: Normal range of motion.  Neurological: He is alert and oriented to person, place, and time.  Skin: Skin is warm and dry.  Psychiatric: He has a normal mood and affect. His behavior is normal.  Nursing note and vitals reviewed.    ED Treatments / Results  Labs (all labs ordered are listed, but only abnormal results are displayed) Labs Reviewed  COMPREHENSIVE METABOLIC PANEL - Abnormal; Notable for the following components:      Result Value   CO2 18 (*)    Glucose, Bld 128 (*)    AST 66 (*)    ALT 66 (*)    All other components within normal limits  CBG MONITORING, ED - Abnormal; Notable for the following components:   Glucose-Capillary 126 (*)    All other components within normal limits  CBC WITH DIFFERENTIAL/PLATELET  TROPONIN I    EKG  EKG Interpretation None       Radiology Ct Head Wo Contrast  Result Date: 02/03/2017 CLINICAL DATA:  Sudden onset of dizziness EXAM: CT HEAD WITHOUT CONTRAST TECHNIQUE: Contiguous axial images were obtained from the base of the skull through the vertex without intravenous contrast. COMPARISON:  None. FINDINGS: Brain: Is there is no mass effect, midline shift, or acute hemorrhage. Mild global atrophy is noted. Dilated perivascular space in the right basal ganglia. Vascular: No hyperdense vessel or unexpected calcification. Skull: Cranium is intact. Sinuses/Orbits: Polypoid mucosal thickening in the maxillary sinuses. There is extensive opacification in the ethmoid air cells. Mild mucosal thickening in the frontal sinuses and sphenoid sinuses. Trace fluid in the left mastoid air cells. Other: Noncontributory. IMPRESSION: There are inflammatory changes in the paranasal sinuses and left mastoid air cells. No acute intracranial pathology. Electronically Signed   By: Marybelle Killings M.D.   On: 02/03/2017 14:20    Procedures Procedures (including critical care time)  Medications Ordered in  ED Medications  sodium chloride 0.9 % bolus 1,000 mL (0 mLs Intravenous Stopped 02/03/17 1522)     Initial Impression / Assessment and Plan / ED Course  I have reviewed the triage vital signs and the nursing notes.  Pertinent labs & imaging results that were available during my care of the patient were reviewed by me and considered in my medical decision making (see chart for details).   Patient appears to be normal in the emergency department.  There are no gross neurological deficits.  His liver functions are minimally elevated, but they are much improved from 2 years ago.  He had been  noncompliant with his blood pressure medicine yesterday.  He was observed for 2 hours and appeared normal.  Will discharge with primary care follow-up.    Final Clinical Impressions(s) / ED Diagnoses   Final diagnoses:  Vertigo    ED Discharge Orders    None       Nat Christen, MD 02/03/17 1606

## 2017-02-03 NOTE — Discharge Instructions (Signed)
Your liver tests were slightly elevated.  However, they are down dramatically from 2 years ago.  Take your blood pressure medicines as prescribed.  Try to eat regular meals.   Avoid alcohol and tobacco.

## 2017-02-03 NOTE — ED Triage Notes (Signed)
Patient states that he was at church when he started to "feel funny in his head." Patient states that he became dizzy with burning and tingling in top of head and became diaphoretic. Patient states started approx 20-52minutes prior to arrival to ED.  Denies any weakness on either side. Denies any facial drooping or slurred speech. Patient states that he has a defibrillator but denies any chest pain. Per patient only had reese cup to eat today approx an hour ago. Per girlfriend patient takes 4 different types of blood pressure medications in which PCP recently made some adjustments to medications. Family hx of strokes.

## 2017-02-13 ENCOUNTER — Telehealth: Payer: Self-pay

## 2017-02-13 NOTE — Telephone Encounter (Signed)
Pt called- he received a letter to be triaged for a tcs. He is not on any blood thinners, he is not having any problems and has not had any heart problems or heart attacks in the past year. You can speak with Butch Penny at [phone].

## 2017-02-20 ENCOUNTER — Telehealth: Payer: Self-pay

## 2017-02-20 NOTE — Telephone Encounter (Signed)
Pt received letter to call DS to set up colonoscopy. No GI problems, no blood thinners or heart attacks. Please call  [phone]

## 2017-02-27 ENCOUNTER — Telehealth: Payer: Self-pay | Admitting: Cardiology

## 2017-02-27 ENCOUNTER — Ambulatory Visit (INDEPENDENT_AMBULATORY_CARE_PROVIDER_SITE_OTHER): Payer: BLUE CROSS/BLUE SHIELD | Admitting: *Deleted

## 2017-02-27 DIAGNOSIS — I469 Cardiac arrest, cause unspecified: Secondary | ICD-10-CM | POA: Diagnosis not present

## 2017-02-27 NOTE — Telephone Encounter (Signed)
I called pt to triage and he drinks alcohol daily. He is afraid of being put to sleep. OV to discuss his needs and concerns on 04/20/2017 at 10:30 Am with Roseanne Kaufman, NP.

## 2017-02-27 NOTE — Telephone Encounter (Signed)
LMOVM reminding pt to send remote transmission.   

## 2017-02-28 ENCOUNTER — Encounter: Payer: Self-pay | Admitting: Cardiology

## 2017-02-28 LAB — CUP PACEART REMOTE DEVICE CHECK
Brady Statistic RV Percent Paced: 0.01 %
Date Time Interrogation Session: 20181205072829
HIGH POWER IMPEDANCE MEASURED VALUE: 73 Ohm
Implantable Lead Implant Date: 20161018
Lead Channel Impedance Value: 285 Ohm
Lead Channel Impedance Value: 399 Ohm
Lead Channel Pacing Threshold Amplitude: 1.25 V
Lead Channel Sensing Intrinsic Amplitude: 7.875 mV
Lead Channel Sensing Intrinsic Amplitude: 7.875 mV
Lead Channel Setting Sensing Sensitivity: 0.3 mV
MDC IDC LEAD LOCATION: 753860
MDC IDC MSMT BATTERY REMAINING LONGEVITY: 123 mo
MDC IDC MSMT BATTERY VOLTAGE: 3 V
MDC IDC MSMT LEADCHNL RV PACING THRESHOLD PULSEWIDTH: 0.4 ms
MDC IDC PG IMPLANT DT: 20161018
MDC IDC SET LEADCHNL RV PACING AMPLITUDE: 2.75 V
MDC IDC SET LEADCHNL RV PACING PULSEWIDTH: 0.4 ms

## 2017-02-28 NOTE — Progress Notes (Signed)
Remote ICD transmission.   

## 2017-04-20 ENCOUNTER — Encounter: Payer: Self-pay | Admitting: Gastroenterology

## 2017-04-20 ENCOUNTER — Other Ambulatory Visit: Payer: Self-pay

## 2017-04-20 ENCOUNTER — Ambulatory Visit (INDEPENDENT_AMBULATORY_CARE_PROVIDER_SITE_OTHER): Payer: BLUE CROSS/BLUE SHIELD | Admitting: Gastroenterology

## 2017-04-20 ENCOUNTER — Telehealth: Payer: Self-pay

## 2017-04-20 VITALS — BP 162/102 | HR 73 | Temp 97.7°F | Ht 70.0 in | Wt 219.2 lb

## 2017-04-20 DIAGNOSIS — R945 Abnormal results of liver function studies: Principal | ICD-10-CM

## 2017-04-20 DIAGNOSIS — R7989 Other specified abnormal findings of blood chemistry: Secondary | ICD-10-CM

## 2017-04-20 DIAGNOSIS — Z1211 Encounter for screening for malignant neoplasm of colon: Secondary | ICD-10-CM | POA: Diagnosis not present

## 2017-04-20 MED ORDER — PEG 3350-KCL-NA BICARB-NACL 420 G PO SOLR: 4000.0000 mL | ORAL | 0 refills | Status: DC

## 2017-04-20 NOTE — Assessment & Plan Note (Addendum)
52 year old male with need for initial screening colonoscopy. Some mild constipation noted but no overt rectal bleeding or other concerning signs. Underwent ICD placement in 2016 after intraoperative VF arrest for parotid mass but has been doing well. Last saw cardiology in April 2018 and scheduled for 1 year follow-up. He will need Propofol for colonoscopy due to daily alcohol use. We will reach out to cardiology to see if he needs to be seen prior to average risk screening colonoscopy and plan for colonoscopy in near future. Addendum 1/31: no need for cardiology to see patient prior to procedure. Proceed as planned.   Proceed with TCS with Dr. Gala Romney in near future: the risks, benefits, and alternatives have been discussed with the patient in detail. The patient states understanding and desires to proceed. Propofol due to history of daily alcohol use, need for monitored anesthesia care Add supplemental fiber daily

## 2017-04-20 NOTE — Assessment & Plan Note (Signed)
Mildly elevated transaminases likely secondary to chronic ETOH use. Declining further evaluation for this (serologies and ultrasound). Discussed at length risks of continued alcohol use. No laboratory indices to suggest chronic liver disease. If patient is willing in the future, would recommend at least an ultrasound to assess liver morphology and serial monitoring of LFTs.

## 2017-04-20 NOTE — Telephone Encounter (Signed)
Dr. Rayann Heman,  Pt is scheduled for a Colonoscopy for the near further with Dr. Gala Romney. Does pt need a Cardio appointment prior to his colonoscopy or is it ok to proceed with procedure.

## 2017-04-20 NOTE — Progress Notes (Signed)
Primary Care Physician:  Celene Squibb, MD Primary Gastroenterologist:  Dr. Gala Romney   Chief Complaint  Patient presents with  . Colonoscopy    due for screening. Patient has defibrillator.  Stephen Hays    HPI:   Stephen Hays is a 52 y.o. male presenting today at the request of Celene Squibb, MD for initial screening colonoscopy. He was brought in for an office visit due to his concerns with sedation. He has a history of VF arrest during operation for parotid mass, found to have Takotsubo's cardiomyopathy and right ventricular dysplasia,  s/p ICD implant 01/12/15. He last saw cardiology in April 2018.   Not as regular BMs as he used to. Noticed in the past few months. Doesn't feel as productive. No fiber. Took a stool softener a few times. Doesn't drink enough water. Drinks a lot of caffeine. Not straining. No rectal bleeding. No dysphagia. Has lots of gas. Sometimes feels bloated with eating.   Past Medical History:  Diagnosis Date  . CAD in native artery 12/2014   nonobstructive by cath  . Hypertension   . parotid mass   . Right ventricular dysplasia   . Tobacco abuse   . 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 Rayann Heman)    Past Surgical History:  Procedure Laterality Date  . BACK SURGERY    . CARDIAC CATHETERIZATION N/A 01/08/2015   Procedure: Left Heart Cath and Coronary Angiography;  Surgeon: Troy Sine, MD;  Location: [place] [facility];  Service: Cardiovascular;  Laterality: N/A;  . EP IMPLANTABLE DEVICE N/A 01/12/2015   Medtronic Visa AF MRI compatible ICD implanted by Dr Rayann Heman for secondary prevention of sudden death  . INCISION AND DRAINAGE     on back near buttocks  . PAROTIDECTOMY Right 01/08/2015   Procedure: ABORTED PAROTIDECTOMY;  Surgeon: Ruby Cola, MD;  Location[facility] OR;  Service: ENT;  Laterality: Right;    Current Outpatient Medications  Medication Sig Dispense Refill  . amLODipine (NORVASC) 5 MG tablet Take 1  tablet (5 mg total) by mouth daily. 30 tablet 6  . carvedilol (COREG) 6.25 MG tablet TAKE 1 TABLET (6.25 MG TOTAL) BY MOUTH 2 (TWO) TIMES DAILY. 180 tablet 3  . cloNIDine (CATAPRES) 0.1 MG tablet Take 0.2 mg by mouth 2 (two) times daily.     [name] Kitchen lisinopril (PRINIVIL,ZESTRIL) 20 MG tablet TAKE 1 TABLET (20 MG TOTAL) BY MOUTH DAILY. 90 tablet 1  . polyethylene glycol-electrolytes (TRILYTE) 420 g solution Take 4,000 mLs by mouth as directed. 4000 mL 0   No current facility-administered medications for this visit.     Allergies as of 04/20/2017 - Review Complete 04/20/2017  Allergen Reaction Noted  . Doxycycline Nausea Only 01/05/2015  . Prednisone Other (See Comments) 01/07/2015    Family History  Problem Relation Age of Onset  . Hypertension Mother   . COPD Mother   . Cancer - Lung Father   . Stroke Sister   . Stroke Brother   . Colon cancer Paternal Grandmother        metastatic   . Colon polyps Neg Hx     Social History   Socioeconomic History  . Marital status: Single    Spouse name: Not on file  . Number of children: Not on file  . Years of education: Not on file  . Highest education level: Not on file  Social Needs  . Financial resource strain: Not  on file  . Food insecurity - worry: Not on file  . Food insecurity - inability: Not on file  . Transportation needs - medical: Not on file  . Transportation needs - non-medical: Not on file  Occupational History  . Not on file  Tobacco Use  . Smoking status: Current Every Day Smoker    Packs/day: 1.00    Years: 30.00    Pack years: 30.00  . Smokeless tobacco: Never Used  . Tobacco comment: trying cut back  Substance and Sexual Activity  . Alcohol use: Yes    Alcohol/week: 3.6 - 4.2 oz    Types: 6 - 7 Cans of beer per week    Comment: 3-4 cans of beer per day, chronic   . Drug use: No  . Sexual activity: Not on file  Other Topics Concern  . Not on file  Social History Narrative  . Not on file    Review of  Systems: Gen: Denies any fever, chills, fatigue, weight loss, lack of appetite.  CV: Denies chest pain, heart palpitations, peripheral edema, syncope.  Resp: Denies shortness of breath at rest or with exertion. Denies wheezing or cough.  GI: see HPI  GU : Denies urinary burning, urinary frequency, urinary hesitancy MS: Denies joint pain, muscle weakness, cramps, or limitation of movement.  Derm: Denies rash, itching, dry skin Psych: Denies depression, anxiety, memory loss, and confusion Heme: Denies bruising, bleeding, and enlarged lymph nodes.  Physical Exam: BP (!) 162/102   Pulse 73   Temp 97.7 F (36.5 C) (Oral)   Ht 5\' 10"  (1.778 m)   Wt 219 lb 3.2 oz (99.4 kg)   BMI 31.45 kg/m  General:   Alert and oriented. Pleasant and cooperative. Well-nourished and well-developed.  Head:  Normocephalic and atraumatic. Eyes:  Without icterus, sclera clear and conjunctiva pink.  Ears:  Normal auditory acuity. Nose:  No deformity, discharge,  or lesions. Mouth:  No deformity or lesions, oral mucosa pink.  Lungs:  Clear to auscultation bilaterally. No wheezes, rales, or rhonchi. No distress.  Heart:  S1, S2 present without murmurs appreciated.  Abdomen:  +BS, soft, non-tender and non-distended. No HSM noted. No guarding or rebound. No masses appreciated.  Rectal:  Deferred  Msk:  Symmetrical without gross deformities. Normal posture. Extremities:  Without edema. Neurologic:  Alert and  oriented x4;  grossly normal neurologically. Skin:  Intact without significant lesions or rashes Psych:  Alert and cooperative. Normal mood and affect.  Lab Results  Component Value Date   WBC 7.6 02/03/2017   HGB 16.4 02/03/2017   HCT 47.2 02/03/2017   MCV 96.9 02/03/2017   PLT 156 02/03/2017   Lab Results  Component Value Date   ALT 66 (H) 02/03/2017   AST 66 (H) 02/03/2017   ALKPHOS 54 02/03/2017   BILITOT 0.9 02/03/2017

## 2017-04-20 NOTE — Patient Instructions (Addendum)
We have scheduled you for a colonoscopy and will let cardiology know that this is scheduled for the near future in case they want to see you prior.  I recommend adding Benefiber or Metamucil daily, increase water intake to help with more productive bowel movements.   I would recommend an ultrasound of your liver due to mildly elevated liver numbers. Let me know if you want to proceed with this in the future.  It was a pleasure to see you today. I strive to create trusting relationships with patients to provide genuine, compassionate, and quality care. I value your feedback. If you receive a survey regarding your visit,  I greatly appreciate you the taking time to fill this out.   Annitta Needs, PhD, ANP-BC [facility] Gastroenterology

## 2017-04-23 ENCOUNTER — Telehealth: Payer: Self-pay

## 2017-04-23 NOTE — Telephone Encounter (Signed)
Tried to call pt to inform of pre-op appt 05/15/17 at 9:00am. Called was picked up then disconnected. Letter mailed.

## 2017-04-23 NOTE — Progress Notes (Signed)
CC'D TO PCP °

## 2017-04-25 NOTE — Telephone Encounter (Signed)
Noted. Routing message.  

## 2017-04-25 NOTE — Telephone Encounter (Signed)
Pt aware of preop appt.

## 2017-04-25 NOTE — Telephone Encounter (Signed)
Pt already scheduled for 05/21/17. Pt aware.

## 2017-04-25 NOTE — Telephone Encounter (Signed)
Ok to proceed if indication without further cardiac workup.

## 2017-04-26 DIAGNOSIS — R945 Abnormal results of liver function studies: Secondary | ICD-10-CM | POA: Diagnosis not present

## 2017-04-26 DIAGNOSIS — H04309 Unspecified dacryocystitis of unspecified lacrimal passage: Secondary | ICD-10-CM | POA: Diagnosis not present

## 2017-05-14 ENCOUNTER — Encounter (HOSPITAL_COMMUNITY): Payer: Self-pay

## 2017-05-15 ENCOUNTER — Encounter (HOSPITAL_COMMUNITY)
Admission: RE | Admit: 2017-05-15 | Discharge: 2017-05-15 | Disposition: A | Discharge status: Home / Self Care | Payer: BLUE CROSS/BLUE SHIELD | Source: Ambulatory Visit | Attending: Internal Medicine | Admitting: Internal Medicine

## 2017-05-21 ENCOUNTER — Ambulatory Visit (HOSPITAL_COMMUNITY)
Admission: RE | Admit: 2017-05-21 | Discharge: 2017-05-21 | Disposition: A | Discharge status: Home / Self Care | Payer: BLUE CROSS/BLUE SHIELD | Source: Ambulatory Visit | Attending: Internal Medicine | Admitting: Internal Medicine

## 2017-05-21 ENCOUNTER — Encounter (HOSPITAL_COMMUNITY)
Admission: RE | Disposition: A | Discharge status: Home / Self Care | Payer: Self-pay | Source: Ambulatory Visit | Attending: Internal Medicine

## 2017-05-21 ENCOUNTER — Encounter (HOSPITAL_COMMUNITY): Payer: Self-pay | Admitting: *Deleted

## 2017-05-21 ENCOUNTER — Ambulatory Visit (HOSPITAL_COMMUNITY): Payer: BLUE CROSS/BLUE SHIELD | Admitting: Anesthesiology

## 2017-05-21 DIAGNOSIS — K5730 Diverticulosis of large intestine without perforation or abscess without bleeding: Secondary | ICD-10-CM | POA: Insufficient documentation

## 2017-05-21 DIAGNOSIS — Z800 Family history of malignant neoplasm of digestive organs: Secondary | ICD-10-CM | POA: Diagnosis not present

## 2017-05-21 DIAGNOSIS — Z1211 Encounter for screening for malignant neoplasm of colon: Secondary | ICD-10-CM

## 2017-05-21 DIAGNOSIS — Z8371 Family history of colonic polyps: Secondary | ICD-10-CM | POA: Diagnosis not present

## 2017-05-21 DIAGNOSIS — D120 Benign neoplasm of cecum: Secondary | ICD-10-CM | POA: Diagnosis not present

## 2017-05-21 DIAGNOSIS — I10 Essential (primary) hypertension: Secondary | ICD-10-CM | POA: Diagnosis not present

## 2017-05-21 DIAGNOSIS — F17210 Nicotine dependence, cigarettes, uncomplicated: Secondary | ICD-10-CM | POA: Insufficient documentation

## 2017-05-21 DIAGNOSIS — Z95810 Presence of automatic (implantable) cardiac defibrillator: Secondary | ICD-10-CM | POA: Diagnosis not present

## 2017-05-21 DIAGNOSIS — I2510 Atherosclerotic heart disease of native coronary artery without angina pectoris: Secondary | ICD-10-CM | POA: Diagnosis not present

## 2017-05-21 DIAGNOSIS — Z888 Allergy status to other drugs, medicaments and biological substances status: Secondary | ICD-10-CM | POA: Insufficient documentation

## 2017-05-21 DIAGNOSIS — Z881 Allergy status to other antibiotic agents status: Secondary | ICD-10-CM | POA: Diagnosis not present

## 2017-05-21 DIAGNOSIS — R945 Abnormal results of liver function studies: Secondary | ICD-10-CM

## 2017-05-21 DIAGNOSIS — R7989 Other specified abnormal findings of blood chemistry: Secondary | ICD-10-CM

## 2017-05-21 HISTORY — DX: Adverse effect of unspecified anesthetic, initial encounter: T41.45XA

## 2017-05-21 HISTORY — DX: Other complications of anesthesia, initial encounter: T88.59XA

## 2017-05-21 HISTORY — PX: COLONOSCOPY WITH PROPOFOL: SHX5780

## 2017-05-21 HISTORY — PX: POLYPECTOMY: SHX5525

## 2017-05-21 SURGERY — COLONOSCOPY WITH PROPOFOL
Anesthesia: Monitor Anesthesia Care

## 2017-05-21 MED ORDER — MIDAZOLAM HCL 2 MG/2ML IJ SOLN
INTRAMUSCULAR | Status: AC
  Filled 2017-05-21: qty 2

## 2017-05-21 MED ORDER — PROPOFOL 500 MG/50ML IV EMUL
INTRAVENOUS | Status: DC | PRN
  Administered 2017-05-21: 150 ug/kg/min via INTRAVENOUS

## 2017-05-21 MED ORDER — MIDAZOLAM HCL 2 MG/2ML IJ SOLN
1.0000 mg | Freq: Once | INTRAMUSCULAR | Status: AC | PRN
  Administered 2017-05-21: 2 mg via INTRAVENOUS

## 2017-05-21 MED ORDER — LACTATED RINGERS IV SOLN
INTRAVENOUS | Status: DC
  Administered 2017-05-21: 09:00:00 via INTRAVENOUS

## 2017-05-21 MED ORDER — CHLORHEXIDINE GLUCONATE CLOTH 2 % EX PADS: 6.0000 | MEDICATED_PAD | Freq: Once | CUTANEOUS | Status: DC

## 2017-05-21 NOTE — Anesthesia Postprocedure Evaluation (Signed)
Anesthesia Post Note  Patient: Stephen Hays  Procedure(s) Performed: COLONOSCOPY WITH PROPOFOL (N/A ) POLYPECTOMY  Patient location during evaluation: PACU Anesthesia Type: MAC Level of consciousness: awake and alert and oriented Pain management: pain level controlled Vital Signs Assessment: post-procedure vital signs reviewed and stable Respiratory status: spontaneous breathing Cardiovascular status: blood pressure returned to baseline Postop Assessment: no apparent nausea or vomiting and adequate PO intake Anesthetic complications: no     Last Vitals:  Vitals:   05/21/17 0822 05/21/17 0853  BP:  (!) 140/93  Pulse: (!) 54   Resp: 18 12  Temp: 36.7 C   SpO2: 100% 100%    Last Pain:  Vitals:   05/21/17 0822  TempSrc: Oral                 Carlia Bomkamp

## 2017-05-21 NOTE — Op Note (Signed)
[facility] Patient Name: Stephen Hays Procedure Date: 05/21/2017 9:02 AM MRN: [id number] Date of Birth: 07-20-1965 Attending MD: Norvel Richards , MD CSN: [id number] Age: 52 Admit Type: Outpatient Procedure:                Colonoscopy Indications:              Screening for colorectal malignant neoplasm Providers:                Norvel Richards, MD, Jeanann Lewandowsky. Sharon Seller, RN,                            Nelma Rothman, Technician Referring MD:             Delphina Cahill, MD Medicines:                Propofol per Anesthesia Complications:            No immediate complications. Estimated Blood Loss:     Estimated blood loss was minimal. Procedure:                Pre-Anesthesia Assessment:                           - Prior to the procedure, a History and Physical                            was performed, and patient medications and                            allergies were reviewed. The patient's tolerance of                            previous anesthesia was also reviewed. The risks                            and benefits of the procedure and the sedation                            options and risks were discussed with the patient.                            All questions were answered, and informed consent                            was obtained. Prior Anticoagulants: The patient has                            taken no previous anticoagulant or antiplatelet                            agents. ASA Grade Assessment: II - A patient with                            mild systemic disease. After reviewing the risks  and benefits, the patient was deemed in                            satisfactory condition to undergo the procedure.                           After obtaining informed consent, the colonoscope                            was passed under direct vision. Throughout the                            procedure, the patient's blood pressure, pulse, and                             oxygen saturations were monitored continuously. The                            EC38-[id number] ([id number]) scope was introduced through                            the and advanced to the the cecum, identified by                            appendiceal orifice and ileocecal valve. The                            colonoscopy was performed without difficulty. The                            patient tolerated the procedure well. The quality                            of the bowel preparation was adequate. The entire                            colon was well visualized. The ileocecal valve,                            appendiceal orifice, and rectum were photographed. Scope In: 9:29:17 AM Scope Out: 9:42:54 AM Scope Withdrawal Time: 0 hours 9 minutes 25 seconds  Total Procedure Duration: 0 hours 13 minutes 37 seconds  Findings:      The perianal and digital rectal examinations were normal.      Scattered small and large-mouthed diverticula were found in the sigmoid       colon and descending colon.      A 5 mm polyp was found in the cecum. The polyp was sessile. The polyp       was removed with a cold snare. Resection and retrieval were complete.       Estimated blood loss was minimal.      The exam was otherwise without abnormality on direct and retroflexion       views. Impression:               - Diverticulosis in  the sigmoid colon and in the                            descending colon.                           - One 5 mm polyp in the cecum, removed with a cold                            snare. Resected and retrieved.                           - The examination was otherwise normal on direct                            and retroflexion views. Moderate Sedation:      Moderate (conscious) sedation was personally administered by an       anesthesia professional. The following parameters were monitored: oxygen       saturation, heart rate, blood pressure, respiratory rate, EKG,  adequacy       of pulmonary ventilation, and response to care. Total physician       intraservice time was 19 minutes. Recommendation:           - Patient has a contact number available for                            emergencies. The signs and symptoms of potential                            delayed complications were discussed with the                            patient. Return to normal activities tomorrow.                            Written discharge instructions were provided to the                            patient.                           - Resume previous diet.                           - Continue present medications.                           - Return to GI clinic (date not yet determined).                           - Repeat colonoscopy date to be determined after                            pending pathology results are reviewed for  surveillance based on pathology results. Procedure Code(s):        --- Professional ---                           [phone], Colonoscopy, flexible; with removal of                            tumor(s), polyp(s), or other lesion(s) by snare                            technique Diagnosis Code(s):        --- Professional ---                           Z12.11, Encounter for screening for malignant                            neoplasm of colon                           D12.0, Benign neoplasm of cecum                           K57.30, Diverticulosis of large intestine without                            perforation or abscess without bleeding CPT copyright 2016 American Medical Association. All rights reserved. The codes documented in this report are preliminary and upon coder review may  be revised to meet current compliance requirements. Cristopher Estimable. Hillari Zumwalt, MD Norvel Richards, MD 05/21/2017 9:49:27 AM This report has been signed electronically. Number of Addenda: 0

## 2017-05-21 NOTE — Discharge Instructions (Signed)
Colonoscopy Discharge Instructions  Read the instructions outlined below and refer to this sheet in the next few weeks. These discharge instructions provide you with general information on caring for yourself after you leave the hospital. Your doctor may also give you specific instructions. While your treatment has been planned according to the most current medical practices available, unavoidable complications occasionally occur. If you have any problems or questions after discharge, call Dr. Gala Romney at [phone]. ACTIVITY  You may resume your regular activity, but move at a slower pace for the next 24 hours.   Take frequent rest periods for the next 24 hours.   Walking will help get rid of the air and reduce the bloated feeling in your belly (abdomen).   No driving for 24 hours (because of the medicine (anesthesia) used during the test).    Do not sign any important legal documents or operate any machinery for 24 hours (because of the anesthesia used during the test).  NUTRITION  Drink plenty of fluids.   You may resume your normal diet as instructed by your doctor.   Begin with a light meal and progress to your normal diet. Heavy or fried foods are harder to digest and may make you feel sick to your stomach (nauseated).   Avoid alcoholic beverages for 24 hours or as instructed.  MEDICATIONS  You may resume your normal medications unless your doctor tells you otherwise.  WHAT YOU CAN EXPECT TODAY  Some feelings of bloating in the abdomen.   Passage of more gas than usual.   Spotting of blood in your stool or on the toilet paper.  IF YOU HAD POLYPS REMOVED DURING THE COLONOSCOPY:  No aspirin products for 7 days or as instructed.   No alcohol for 7 days or as instructed.   Eat a soft diet for the next 24 hours.  FINDING OUT THE RESULTS OF YOUR TEST Not all test results are available during your visit. If your test results are not back during the visit, make an appointment  with your caregiver to find out the results. Do not assume everything is normal if you have not heard from your caregiver or the medical facility. It is important for you to follow up on all of your test results.  SEEK IMMEDIATE MEDICAL ATTENTION IF:  You have more than a spotting of blood in your stool.   Your belly is swollen (abdominal distention).   You are nauseated or vomiting.   You have a temperature over 101.   You have abdominal pain or discomfort that is severe or gets worse throughout the day.    Colon polyp and diverticulosis information provided  Further recommendations to follow pending review of pathology report      Colon Polyps Polyps are tissue growths inside the body. Polyps can grow in many places, including the large intestine (colon). A polyp may be a round bump or a mushroom-shaped growth. You could have one polyp or several. Most colon polyps are noncancerous (benign). However, some colon polyps can become cancerous over time. What are the causes? The exact cause of colon polyps is not known. What increases the risk? This condition is more likely to develop in people who:  Have a family history of colon cancer or colon polyps.  Are older than 46 or older than 45 if they are African American.  Have inflammatory bowel disease, such as ulcerative colitis or Crohn disease.  Are overweight.  Smoke cigarettes.  Do not get enough exercise.  Drink too much alcohol. °· Eat a diet that is: °? High in fat and red meat. °? Low in fiber. °· Had childhood cancer that was treated with abdominal radiation. ° °What are the signs or symptoms? °Most polyps do not cause symptoms. If you have symptoms, they may include: °· Blood coming from your rectum when having a bowel movement. °· Blood in your stool. The stool may look dark red or black. °· A change in bowel habits, such as constipation or diarrhea. ° °How is this diagnosed? °This condition is diagnosed with a  colonoscopy. This is a procedure that uses a lighted, flexible scope to look at the inside of your colon. °How is this treated? °Treatment for this condition involves removing any polyps that are found. Those polyps will then be tested for cancer. If cancer is found, your health care provider will talk to you about options for colon cancer treatment. °Follow these instructions at home: °Diet °· Eat plenty of fiber, such as fruits, vegetables, and whole grains. °· Eat foods that are high in calcium and vitamin D, such as milk, cheese, yogurt, eggs, liver, fish, and broccoli. °· Limit foods high in fat, red meats, and processed meats, such as hot dogs, sausage, bacon, and lunch meats. °· Maintain a healthy weight, or lose weight if recommended by your health care provider. °General instructions °· Do not smoke cigarettes. °· Do not drink alcohol excessively. °· Keep all follow-up visits as told by your health care provider. This is important. This includes keeping regularly scheduled colonoscopies. Talk to your health care provider about when you need a colonoscopy. °· Exercise every day or as told by your health care provider. °Contact a health care provider if: °· You have new or worsening bleeding during a bowel movement. °· You have new or increased blood in your stool. °· You have a change in bowel habits. °· You unexpectedly lose weight. °This information is not intended to replace advice given to you by your health care provider. Make sure you discuss any questions you have with your health care provider. °Document Released: 12/08/2003 Document Revised: 08/19/2015 Document Reviewed: 02/01/2015 °Elsevier Interactive Patient Education © 2018 Elsevier Inc. ° ° ° ° °Diverticulosis °Diverticulosis is a condition that develops when small pouches (diverticula) form in the wall of the large intestine (colon). The colon is where water is absorbed and stool is formed. The pouches form when the inside layer of the colon  pushes through weak spots in the outer layers of the colon. You may have a few pouches or many of them. °What are the causes? °The cause of this condition is not known. °What increases the risk? °The following factors may make you more likely to develop this condition: °· Being older than age 60. Your risk for this condition increases with age. Diverticulosis is rare among people younger than age 30. By age 80, many people have it. °· Eating a low-fiber diet. °· Having frequent constipation. °· Being overweight. °· Not getting enough exercise. °· Smoking. °· Taking over-the-counter pain medicines, like aspirin and ibuprofen. °· Having a family history of diverticulosis. ° °What are the signs or symptoms? °In most people, there are no symptoms of this condition. If you do have symptoms, they may include: °· Bloating. °· Cramps in the abdomen. °· Constipation or diarrhea. °· Pain in the lower left side of the abdomen. ° °How is this diagnosed? °This condition is most often diagnosed during an exam for other colon problems. Because   diverticulosis usually has no symptoms, it often cannot be diagnosed independently. This condition may be diagnosed by:  Using a flexible scope to examine the colon (colonoscopy).  Taking an X-ray of the colon after dye has been put into the colon (barium enema).  Doing a CT scan.  How is this treated? You may not need treatment for this condition if you have never developed an infection related to diverticulosis. If you have had an infection before, treatment may include:  Eating a high-fiber diet. This may include eating more fruits, vegetables, and grains.  Taking a fiber supplement.  Taking a live bacteria supplement (probiotic).  Taking medicine to relax your colon.  Taking antibiotic medicines.  Follow these instructions at home:  Drink 6-8 glasses of water or more each day to prevent constipation.  Try not to strain when you have a bowel movement.  If you  have had an infection before: ? Eat more fiber as directed by your health care provider or your diet and nutrition specialist (dietitian). ? Take a fiber supplement or probiotic, if your health care provider approves.  Take over-the-counter and prescription medicines only as told by your health care provider.  If you were prescribed an antibiotic, take it as told by your health care provider. Do not stop taking the antibiotic even if you start to feel better.  Keep all follow-up visits as told by your health care provider. This is important. Contact a health care provider if:  You have pain in your abdomen.  You have bloating.  You have cramps.  You have not had a bowel movement in 3 days. Get help right away if:  Your pain gets worse.  Your bloating becomes very bad.  You have a fever or chills, and your symptoms suddenly get worse.  You vomit.  You have bowel movements that are bloody or black.  You have bleeding from your rectum. Summary  Diverticulosis is a condition that develops when small pouches (diverticula) form in the wall of the large intestine (colon).  You may have a few pouches or many of them.  This condition is most often diagnosed during an exam for other colon problems.  If you have had an infection related to diverticulosis, treatment may include increasing the fiber in your diet, taking supplements, or taking medicines. This information is not intended to replace advice given to you by your health care provider. Make sure you discuss any questions you have with your health care provider. Document Released: 12/09/2003 Document Revised: 01/31/2016 Document Reviewed: 01/31/2016 Elsevier Interactive Patient Education  2017 Elsevier Inc.    PATIENT INSTRUCTIONS POST-ANESTHESIA  IMMEDIATELY FOLLOWING SURGERY:  Do not drive or operate machinery for the first twenty four hours after surgery.  Do not make any important decisions for twenty four hours  after surgery or while taking narcotic pain medications or sedatives.  If you develop intractable nausea and vomiting or a severe headache please notify your doctor immediately.  FOLLOW-UP:  Please make an appointment with your surgeon as instructed. You do not need to follow up with anesthesia unless specifically instructed to do so.  WOUND CARE INSTRUCTIONS (if applicable):  Keep a dry clean dressing on the anesthesia/puncture wound site if there is drainage.  Once the wound has quit draining you may leave it open to air.  Generally you should leave the bandage intact for twenty four hours unless there is drainage.  If the epidural site drains for more than 36-48 hours please call the  anesthesia department.  QUESTIONS?:  Please feel free to call your physician or the hospital operator if you have any questions, and they will be happy to assist you.

## 2017-05-21 NOTE — H&P (Signed)
@LOGO @   Primary Care Physician:  Celene Squibb, MD Primary Gastroenterologist:  Dr. Gala Romney  Pre-Procedure History & Physical: HPI:  Stephen Hays is a 52 y.o. male is here for a screening colonoscopy.   No bowel symptoms. No first-degree relatives with colon cancer. Patient with ICD. Past Medical History:  Diagnosis Date  . CAD in native artery 12/2014   nonobstructive by cath  . Hypertension   . parotid mass   . Right ventricular dysplasia   . Tobacco abuse   . 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 Rayann Heman)    Past Surgical History:  Procedure Laterality Date  . BACK SURGERY    . CARDIAC CATHETERIZATION N/A 01/08/2015   Procedure: Left Heart Cath and Coronary Angiography;  Surgeon: Troy Sine, MD;  Location: [place];  Service: Cardiovascular;  Laterality: N/A;  . EP IMPLANTABLE DEVICE N/A 01/12/2015   Medtronic Visa AF MRI compatible ICD implanted by Dr Rayann Heman for secondary prevention of sudden death  . INCISION AND DRAINAGE     on back near buttocks  . PAROTIDECTOMY Right 01/08/2015   Procedure: ABORTED PAROTIDECTOMY;  Surgeon: Ruby Cola, MD;  Location: [facility] OR;  Service: ENT;  Laterality: Right;    Prior to Admission medications   Medication Sig Start Date End Date Taking? Authorizing Provider  amLODipine (NORVASC) 5 MG tablet Take 1 tablet (5 mg total) by mouth daily. 07/07/16 05/09/17 Yes Allred, Jeneen Rinks, MD  carvedilol (COREG) 6.25 MG tablet TAKE 1 TABLET (6.25 MG TOTAL) BY MOUTH 2 (TWO) TIMES DAILY. 02/01/17  Yes Allred, Jeneen Rinks, MD  cloNIDine (CATAPRES) 0.2 MG tablet Take 0.2 mg by mouth 2 (two) times daily.    Yes [provider]  lisinopril (PRINIVIL,ZESTRIL) 20 MG tablet TAKE 1 TABLET (20 MG TOTAL) BY MOUTH DAILY. 01/22/17 05/09/17 Yes AllredJeneen Rinks, MD    Allergies as of 04/20/2017 - Review Complete 04/20/2017  Allergen Reaction Noted  . Doxycycline Nausea Only 01/05/2015  . Prednisone Other (See  Comments) 01/07/2015    Family History  Problem Relation Age of Onset  . Hypertension Mother   . COPD Mother   . Cancer - Lung Father   . Stroke Sister   . Stroke Brother   . Colon cancer Paternal Grandmother        metastatic   . Colon polyps Neg Hx     Social History   Socioeconomic History  . Marital status: Single    Spouse name: Not on file  . Number of children: Not on file  . Years of education: Not on file  . Highest education level: Not on file  Social Needs  . Financial resource strain: Not on file  . Food insecurity - worry: Not on file  . Food insecurity - inability: Not on file  . Transportation needs - medical: Not on file  . Transportation needs - non-medical: Not on file  Occupational History  . Not on file  Tobacco Use  . Smoking status: Current Every Day Smoker    Packs/day: 1.00    Years: 30.00    Pack years: 30.00  . Smokeless tobacco: Never Used  . Tobacco comment: trying cut back  Substance and Sexual Activity  . Alcohol use: Yes    Alcohol/week: 3.6 - 4.2 oz    Types: 6 - 7 Cans of beer per week    Comment: 3-4 cans of beer per day, chronic   .  Drug use: No  . Sexual activity: Not on file  Other Topics Concern  . Not on file  Social History Narrative  . Not on file    Review of Systems: See HPI, otherwise negative ROS  Physical Exam: Pulse (!) 54   Temp 98 F (36.7 C) (Oral)   Resp 18   SpO2 100%  General:   Alert,  Well-developed, well-nourished, pleasant and cooperative in NAD Lungs:  Clear throughout to auscultation.   No wheezes, crackles, or rhonchi. No acute distress. Heart:  Regular rate and rhythm; no murmurs, clicks, rubs,  or gallops. Abdomen:  Soft, nontender and nondistended. No masses, hepatosplenomegaly or hernias noted. Normal bowel sounds, without guarding, and without rebound.     Impression/Plan: [name] is now here to undergo a screening colonoscopy.  First ever average her screening  examination.  Risks, benefits, limitations, imponderables and alternatives regarding colonoscopy have been reviewed with the patient. Questions have been answered. All parties agreeable.     Notice:  This dictation was prepared with Dragon dictation along with smaller phrase technology. Any transcriptional errors that result from this process are unintentional and may not be corrected upon review.

## 2017-05-21 NOTE — Transfer of Care (Signed)
Immediate Anesthesia Transfer of Care Note  Patient: Stephen Hays  Procedure(s) Performed: COLONOSCOPY WITH PROPOFOL (N/A ) POLYPECTOMY  Patient Location: PACU  Anesthesia Type:MAC  Level of Consciousness: awake and alert   Airway & Oxygen Therapy: Patient Spontanous Breathing and Patient connected to nasal cannula oxygen  Post-op Assessment: Report given to RN  Post vital signs: Reviewed and stable  Last Vitals:  Vitals:   05/21/17 0822 05/21/17 0853  BP:  (!) 140/93  Pulse: (!) 54   Resp: 18 12  Temp: 36.7 C   SpO2: 100% 100%    Last Pain:  Vitals:   05/21/17 0822  TempSrc: Oral      Patients Stated Pain Goal: 5 (88/28/00 3491)  Complications: No apparent anesthesia complications

## 2017-05-21 NOTE — Addendum Note (Signed)
Addendum  created 05/21/17 1018 by Ollen Bowl, CRNA   Intraprocedure Staff edited

## 2017-05-21 NOTE — Anesthesia Preprocedure Evaluation (Addendum)
Anesthesia Evaluation  Patient identified by MRN, date of birth, ID band Patient awake    History of Anesthesia Complications (+) history of anesthetic complications (hx of cardiac arrest "18" minutes after GA for ENT surgery on neck.  negative work-up, etiology not known)  Airway Mallampati: I  TM Distance: >3 FB     Dental no notable dental hx. (+) Teeth Intact   Pulmonary Current Smoker,    Pulmonary exam normal    rales (very slight "pop"  smoker lungs, otherwiside CTA)    Cardiovascular Exercise Tolerance: Good METS: 7 - 9 Mets hypertension, Pt. on medications and Pt. on home beta blockers + CAD  + dysrhythmias + Cardiac Defibrillator  Rhythm:Regular Rate:Normal  - Upper normal LV wall thickness with LVEF 65-70% and grossly   normal diastolic function. MAC with trivial mitral regurgitation.   Moderate left atrial enlargement. Aortic annular calcification.   Device wire noted within the right heart. Mild right atrial   enlargement. Trivial tricuspid regurgitation with PASP 19 mmHg.   (2017)  ECG  RBBB, NSR  (2018)  Prox LAD lesion, 30% stenosed.  Mid LAD lesion, 15% stenosed.  The left ventricular systolic function is normal.   Normal LV function with an ejection fraction of at least 60-65% but with a focal region of apical ballooning.  Mild nonobstructive CAD with smooth 30% ostial LAD stenosis, calcified proximal to mid LAD with 10 -20% narrowing and an LAD which becomes  small caliber in the apical segment without obstructive stenosis; normal left circumflex and normal dominant RCA.  RECOMMENDATION: Medical therapy.  Smoking cessation is imperative.  Aggressive statin therapy.  Beta blocker therapy will be added to his antihypertensive medication regimen.  (2016)    Neuro/Psych    GI/Hepatic (+) Cirrhosis       , Hx increased LFT's, better nowResults for KEANO, GUGGENHEIM (MRN [id number]) as of 05/21/2017  08:33  02/03/2017 13:41 Alkaline Phosphatase: 54 Albumin: 4.6 AST: 66 (H) ALT: 66 (H) Total Protein: 7.6 Total Bilirubin: 0.9    Endo/Other    Renal/GU      Musculoskeletal   Abdominal Normal abdominal exam  (+)   Peds  Hematology   Anesthesia Other Findings   Reproductive/Obstetrics                            Anesthesia Physical Anesthesia Plan  ASA: III  Anesthesia Plan:    Post-op Pain Management:    Induction:   PONV Risk Score and Plan:   Airway Management Planned: Simple Face Mask  Additional Equipment:   Intra-op Plan:   Post-operative Plan:   Informed Consent: I have reviewed the patients History and Physical, chart, labs and discussed the procedure including the risks, benefits and alternatives for the proposed anesthesia with the patient or authorized representative who has indicated his/her understanding and acceptance.   Dental advisory given  Plan Discussed with: CRNA  Anesthesia Plan Comments:         Anesthesia Quick Evaluation

## 2017-05-22 ENCOUNTER — Encounter: Payer: Self-pay | Admitting: Internal Medicine

## 2017-05-22 ENCOUNTER — Encounter (HOSPITAL_COMMUNITY): Payer: Self-pay | Admitting: Internal Medicine

## 2017-05-29 ENCOUNTER — Ambulatory Visit (INDEPENDENT_AMBULATORY_CARE_PROVIDER_SITE_OTHER): Payer: BLUE CROSS/BLUE SHIELD | Admitting: *Deleted

## 2017-05-29 DIAGNOSIS — I469 Cardiac arrest, cause unspecified: Secondary | ICD-10-CM | POA: Diagnosis not present

## 2017-05-29 DIAGNOSIS — I4901 Ventricular fibrillation: Secondary | ICD-10-CM

## 2017-05-29 DIAGNOSIS — Z959 Presence of cardiac and vascular implant and graft, unspecified: Secondary | ICD-10-CM

## 2017-05-29 LAB — CUP PACEART REMOTE DEVICE CHECK
Battery Remaining Longevity: 121 mo
Battery Voltage: 3.01 V
HighPow Impedance: 63 Ohm
Implantable Lead Implant Date: 20161018
Implantable Lead Location: 753860
Implantable Pulse Generator Implant Date: 20161018
Lead Channel Impedance Value: 342 Ohm
Lead Channel Pacing Threshold Pulse Width: 0.4 ms
Lead Channel Sensing Intrinsic Amplitude: 9.75 mV
Lead Channel Setting Pacing Amplitude: 2.5 V
Lead Channel Setting Pacing Pulse Width: 0.4 ms
MDC IDC MSMT LEADCHNL RV IMPEDANCE VALUE: 247 Ohm
MDC IDC MSMT LEADCHNL RV PACING THRESHOLD AMPLITUDE: 1.25 V
MDC IDC MSMT LEADCHNL RV SENSING INTR AMPL: 9.75 mV
MDC IDC SESS DTM: 20190305081605
MDC IDC SET LEADCHNL RV SENSING SENSITIVITY: 0.3 mV
MDC IDC STAT BRADY RV PERCENT PACED: 0.01 %

## 2017-05-29 NOTE — Progress Notes (Signed)
Remote ICD transmission.   

## 2017-05-30 ENCOUNTER — Encounter: Payer: Self-pay | Admitting: Cardiology

## 2017-06-25 ENCOUNTER — Other Ambulatory Visit: Payer: Self-pay | Admitting: Internal Medicine

## 2017-06-29 ENCOUNTER — Encounter: Payer: BLUE CROSS/BLUE SHIELD | Admitting: Internal Medicine

## 2017-07-27 ENCOUNTER — Encounter: Payer: Self-pay | Admitting: Internal Medicine

## 2017-07-27 ENCOUNTER — Ambulatory Visit: Payer: BLUE CROSS/BLUE SHIELD | Admitting: Internal Medicine

## 2017-07-27 VITALS — BP 171/111 | HR 68 | Ht 70.0 in | Wt 216.8 lb

## 2017-07-27 DIAGNOSIS — I119 Hypertensive heart disease without heart failure: Secondary | ICD-10-CM

## 2017-07-27 DIAGNOSIS — I4901 Ventricular fibrillation: Secondary | ICD-10-CM

## 2017-07-27 DIAGNOSIS — Q208 Other congenital malformations of cardiac chambers and connections: Secondary | ICD-10-CM

## 2017-07-27 DIAGNOSIS — Q249 Congenital malformation of heart, unspecified: Secondary | ICD-10-CM

## 2017-07-27 LAB — CUP PACEART INCLINIC DEVICE CHECK
Battery Voltage: 3 V
Brady Statistic RV Percent Paced: 0.01 %
Date Time Interrogation Session: 20190503175000
HighPow Impedance: 70 Ohm
Implantable Lead Implant Date: 20161018
Lead Channel Impedance Value: 285 Ohm
Lead Channel Impedance Value: 399 Ohm
Lead Channel Pacing Threshold Amplitude: 1.125 V
Lead Channel Sensing Intrinsic Amplitude: 9.625 mV
Lead Channel Setting Pacing Amplitude: 2.75 V
Lead Channel Setting Sensing Sensitivity: 0.3 mV
MDC IDC LEAD LOCATION: 753860
MDC IDC MSMT BATTERY REMAINING LONGEVITY: 119 mo
MDC IDC MSMT LEADCHNL RV PACING THRESHOLD PULSEWIDTH: 0.4 ms
MDC IDC MSMT LEADCHNL RV SENSING INTR AMPL: 11.875 mV
MDC IDC PG IMPLANT DT: 20161018
MDC IDC SET LEADCHNL RV PACING PULSEWIDTH: 0.4 ms

## 2017-07-27 MED ORDER — AMLODIPINE BESYLATE 10 MG PO TABS: 10.0000 mg | ORAL_TABLET | Freq: Every day | ORAL | 1 refills | Status: DC

## 2017-07-27 NOTE — Progress Notes (Signed)
PCP: Celene Squibb, MD Primary Cardiologist:  Dr Claiborne Billings Primary EP: Dr Rayann Heman  Stephen Hays is a 52 y.o. male who presents today for routine electrophysiology followup.  Since last being seen in our clinic, the patient reports doing very well. His BP has been high. Today, he denies symptoms of palpitations, chest pain, shortness of breath,  lower extremity edema, dizziness, presyncope, syncope, or ICD shocks.  The patient is otherwise without complaint today.   Past Medical History:  Diagnosis Date  . CAD in native artery 12/2014   nonobstructive by cath  . Complication of anesthesia   . Hypertension   . parotid mass   . Right ventricular dysplasia   . Tobacco abuse   . 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 Rayann Heman)   Past Surgical History:  Procedure Laterality Date  . BACK SURGERY    . CARDIAC CATHETERIZATION N/A 01/08/2015   Procedure: Left Heart Cath and Coronary Angiography;  Surgeon: Troy Sine, MD;  Location: [facility];  Service: Cardiovascular;  Laterality: N/A;  . COLONOSCOPY WITH PROPOFOL N/A 05/21/2017   Procedure: COLONOSCOPY WITH PROPOFOL;  Surgeon: Daneil Dolin, MD;  Location: [facility];  Service: Endoscopy;  Laterality: N/A;  9:30am  . EP IMPLANTABLE DEVICE N/A 01/12/2015   Medtronic Visa AF MRI compatible ICD implanted by Dr Rayann Heman for secondary prevention of sudden death  . INCISION AND DRAINAGE     on back near buttocks  . PAROTIDECTOMY Right 01/08/2015   Procedure: ABORTED PAROTIDECTOMY;  Surgeon: Ruby Cola, MD;  Location: [facility];  Service: ENT;  Laterality: Right;  . POLYPECTOMY  05/21/2017   Procedure: POLYPECTOMY;  Surgeon: Daneil Dolin, MD;  Location: [facility];  Service: Endoscopy;;  colon    ROS- all systems are reviewed and negative except as per HPI above  Current Outpatient Medications  Medication Sig Dispense Refill  . amLODipine (NORVASC) 5 MG tablet Take 1 tablet (5 mg total)  by mouth daily. 30 tablet 6  . carvedilol (COREG) 6.25 MG tablet TAKE 1 TABLET (6.25 MG TOTAL) BY MOUTH 2 (TWO) TIMES DAILY. 180 tablet 3  . cloNIDine (CATAPRES) 0.2 MG tablet Take 0.2 mg by mouth 2 (two) times daily.     [name] Kitchen lisinopril (PRINIVIL,ZESTRIL) 20 MG tablet TAKE 1 TABLET (20 MG TOTAL) BY MOUTH DAILY. 90 tablet 1   No current facility-administered medications for this visit.     Physical Exam: Vitals:   07/27/17 1322 07/27/17 1327  BP: (!) 172/101 (!) 171/111  Pulse: 69 68  SpO2: 99%   Weight: 216 lb 12.8 oz (98.3 kg)   Height: 5\' 10"  (1.778 m)     GEN- The patient is well appearing, alert and oriented x 3 today.   Head- normocephalic, atraumatic Eyes-  Sclera clear, conjunctiva pink Ears- hearing intact Oropharynx- clear Lungs- Clear to ausculation bilaterally, normal work of breathing Chest- ICD pocket is well healed Heart- Regular rate and rhythm, no murmurs, rubs or gallops, PMI not laterally displaced GI- soft, NT, ND, + BS Extremities- no clubbing, cyanosis, or edema  ICD interrogation- reviewed in detail today,  See PACEART report   Assessment and Plan:  1.  ARVD No arrhythmias on ICD interrogation Stable on an appropriate medical regimen Normal ICD function See Pace Art report No changes today  2. Hypertensive cardiovascular disease BP is markedly elevated Increase norvasc to 10mg  daily today Consider spironolactone if BP  remains elevated Will need to consider secondary causes of hypertension.  I have advised MRI or renal arterial doppler to evaluate for renal artery stenosis.  He does not want to have another MRI (claustriphobia).   3. Tobacco Cessation advised  4. CAD Nonobstructive CAD  5. ED Stable No change required today  [facility] Follow-up with cardiology APP for BP management in 6 weeks Return to see me in a year  Thompson Grayer MD, [facility] 07/27/2017 1:48 PM

## 2017-07-27 NOTE — Patient Instructions (Addendum)
Medication Instructions:   Your physician has recommended you make the following change in your medication:   Increase amlodipine (norvasc) to 10 mg by mouth daily. You may take (2) of your 5 mg tablets daily until they are finished.  Continue all other medications the same.  Labwork:  NONE  Testing/Procedures:  NONE  Follow-Up: Your physician recommends that you schedule a follow-up appointment in: 1 year with Dr. Rayann Heman. Please schedule this appointment today before leaving the office. Your physician recommends that you schedule a follow-up appointment in: 6 weeks with Bernerd Pho PA at the [place] office.   Any Other Special Instructions Will Be Listed Below (If Applicable).  Your next device check from home is on 08/28/17   If you need a refill on your cardiac medications before your next appointment, please call your pharmacy.

## 2017-08-28 ENCOUNTER — Ambulatory Visit (INDEPENDENT_AMBULATORY_CARE_PROVIDER_SITE_OTHER): Payer: BLUE CROSS/BLUE SHIELD | Admitting: *Deleted

## 2017-08-28 DIAGNOSIS — I469 Cardiac arrest, cause unspecified: Secondary | ICD-10-CM | POA: Diagnosis not present

## 2017-08-28 NOTE — Progress Notes (Signed)
Remote ICD transmission.   

## 2017-09-07 ENCOUNTER — Ambulatory Visit: Payer: BLUE CROSS/BLUE SHIELD | Admitting: Student

## 2017-09-13 ENCOUNTER — Ambulatory Visit: Payer: BLUE CROSS/BLUE SHIELD | Admitting: Student

## 2017-10-03 NOTE — Progress Notes (Signed)
Cardiology Office Note    Date:  10/04/2017   ID:  Stephen Hays, Stephen Hays 12/16/1965, MRN [id number]  PCP:  Celene Squibb, MD  Cardiologist: Previously Dr. Claiborne Billings in 2016 --> Has been followed by EP since EP: Dr. Rayann Heman  Chief Complaint  Patient presents with  . Follow-up    Elevated BP    History of Present Illness:    Stephen Hays is a 52 y.o. male with past medical history of VF arrest (occurring in 12/2014 with cath showing mild nonobstructive CAD), ARVD (diagnosed by MRI in 12/2014, s/p Medtronic ICD placement), HTN, and tobacco use who presents to the office today for 6-week follow-up.   He was last examined by Dr. Rayann Heman on 07/27/2017 and reported overall doing well, denying and recent chest pain or dyspnea on exertion. BP was significantly elevated at 172/101 at the time of his visit and Amlodipine was increased from 16m daily to 164mdaily along with him continuing on Coreg 6.2540mID, Clonidine 0.2mg57mD, and Lisinopril 20mg62mly.   In talking with the patient today, he reports doing well since his last office visit and denies any recent anginal symptoms. He is active at baseline while at work and around the house. Denies any recent chest pain, dyspnea on exertion, orthopnea, PND, lower extremity edema, or palpitations.  He does not check his blood pressure at home as his blood pressure cuff broke several months ago. He reports compliance with his medication regimen. Does mention that he developed erectile dysfunction after being started on Carvedilol in 2016. Uses Sildenafil as needed.   Past Medical History:  Diagnosis Date  . CAD in native artery 12/2014   nonobstructive by cath  . Complication of anesthesia   . Hypertension   . parotid mass   . Right ventricular dysplasia   . Tobacco abuse   . 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 AllreRayann HemanPast Surgical History:  Procedure Laterality Date  . BACK SURGERY    . CARDIAC  CATHETERIZATION N/A 01/08/2015   Procedure: Left Heart Cath and Coronary Angiography;  Surgeon: ThomaTroy Sine  [facility];  Service: Cardiovascular;  Laterality: N/A;  . COLONOSCOPY WITH PROPOFOL N/A 05/21/2017   Procedure: COLONOSCOPY WITH PROPOFOL;  Surgeon: RourkDaneil Dolin  [facility];  Service: Endoscopy;  Laterality: N/A;  9:30am  . EP IMPLANTABLE DEVICE N/A 01/12/2015   Medtronic Visa AF MRI compatible ICD implanted by Dr AllreRayann Hemansecondary prevention of sudden death  . INCISION AND DRAINAGE     on back near buttocks  . PAROTIDECTOMY Right 01/08/2015   Procedure: ABORTED PAROTIDECTOMY;  Surgeon: MitchRuby Cola  [facility];  Laterality: Right;  . POLYPECTOMY  05/21/2017   Procedure: POLYPECTOMY;  Surgeon: RourkDaneil Dolin  [facility];  Service: Endoscopy;;  colon    Current Medications: Outpatient Medications Prior to Visit  Medication Sig Dispense Refill  . amLODipine (NORVASC) 10 MG tablet Take 1 tablet (10 mg total) by mouth daily. 90 tablet 1  . carvedilol (COREG) 6.25 MG tablet TAKE 1 TABLET (6.25 MG TOTAL) BY MOUTH 2 (TWO) TIMES DAILY. 180 tablet 3  . cloNIDine (CATAPRES) 0.2 MG tablet Take 0.2 mg by mouth 2 (two) times daily.     . lisMarland Kitchennopril (PRINIVIL,ZESTRIL) 20 MG tablet TAKE 1 TABLET (20 MG TOTAL) BY MOUTH DAILY. [address]  tablet 1   No facility-administered medications prior to visit.      Allergies:   Doxycycline and Prednisone   Social History   Socioeconomic History  . Marital status: Single    Spouse name: Not on file  . Number of children: Not on file  . Years of education: Not on file  . Highest education level: Not on file  Occupational History  . Not on file  Social Needs  . Financial resource strain: Not on file  . Food insecurity:    Worry: Not on file    Inability: Not on file  . Transportation needs:    Medical: Not on file    Non-medical: Not on file  Tobacco Use  .  Smoking status: Current Every Day Smoker    Packs/day: 1.00    Years: 30.00    Pack years: 30.00  . Smokeless tobacco: Never Used  . Tobacco comment: trying cut back  Substance and Sexual Activity  . Alcohol use: Yes    Alcohol/week: 3.6 - 4.2 oz    Types: 6 - 7 Cans of beer per week    Comment: 3-4 cans of beer per day, chronic   . Drug use: No  . Sexual activity: Not on file  Lifestyle  . Physical activity:    Days per week: Not on file    Minutes per session: Not on file  . Stress: Not on file  Relationships  . Social connections:    Talks on phone: Not on file    Gets together: Not on file    Attends religious service: Not on file    Active member of club or organization: Not on file    Attends meetings of clubs or organizations: Not on file    Relationship status: Not on file  Other Topics Concern  . Not on file  Social History Narrative  . Not on file     Family History:  The patient's family history includes COPD in his mother; Cancer - Lung in his father; Colon cancer in his paternal grandmother; Hypertension in his mother; Stroke in his brother and sister.   Review of Systems:   Please see the history of present illness.     General:  No chills, fever, night sweats or weight changes.  Cardiovascular:  No chest pain, dyspnea on exertion, edema, orthopnea, palpitations, paroxysmal nocturnal dyspnea. Positive for ED.  Dermatological: No rash, lesions/masses Respiratory: No cough, dyspnea Urologic: No hematuria, dysuria Abdominal:   No nausea, vomiting, diarrhea, bright red blood per rectum, melena, or hematemesis Neurologic:  No visual changes, wkns, changes in mental status.  All other systems reviewed and are otherwise negative except as noted above.   Physical Exam:    VS:  BP 132/82   Pulse 72   Ht 5' 10"  (1.778 m)   Wt 211 lb (95.7 kg)   SpO2 98%   BMI 30.28 kg/m    General: Well developed, well nourished Caucasian male appearing in no acute  distress. Head: Normocephalic, atraumatic, sclera non-icteric, no xanthomas, nares are without discharge.  Neck: No carotid bruits. JVD not elevated.  Lungs: Respirations regular and unlabored, without wheezes or rales.  Heart: Regular rate and rhythm. No S3 or S4.  No murmur, no rubs, or gallops appreciated. Abdomen: Soft, non-tender, non-distended with normoactive bowel sounds. No hepatomegaly. No rebound/guarding. No obvious abdominal masses. Msk:  Strength and tone appear normal for age. No joint deformities or effusions. Extremities: No clubbing or cyanosis. No  lower extremity edema.  Distal pedal pulses are 2+ bilaterally. Neuro: Alert and oriented X 3. Moves all extremities spontaneously. No focal deficits noted. Psych:  Responds to questions appropriately with a normal affect. Skin: No rashes or lesions noted  Wt Readings from Last 3 Encounters:  10/04/17 211 lb (95.7 kg)  07/27/17 216 lb 12.8 oz (98.3 kg)  04/20/17 219 lb 3.2 oz (99.4 kg)      Studies/Labs Reviewed:   EKG:  EKG is not ordered today.    Recent Labs: 02/03/2017: ALT 66; BUN 7; Creatinine, Ser 0.73; Hemoglobin 16.4; Platelets 156; Potassium 4.0; Sodium 135   Lipid Panel No results found for: CHOL, TRIG, HDL, CHOLHDL, VLDL, LDLCALC, LDLDIRECT  Additional studies/ records that were reviewed today include:   Echocardiogram: 10/2015 Study Conclusions  - Left ventricle: The cavity size was normal. Wall thickness was at   the upper limits of normal. Systolic function was vigorous. The   estimated ejection fraction was in the range of 65% to 70%. Wall   motion was normal; there were no regional wall motion   abnormalities. Left ventricular diastolic function parameters   were normal for the patient&'s age. - Aortic valve: Mildly calcified annulus. Trileaflet; mildly   calcified leaflets. - Mitral valve: Calcified annulus. There was trivial regurgitation. - Left atrium: The atrium was moderately dilated. -  Right ventricle: Pacer wire or catheter noted in right ventricle. - Right atrium: The atrium was mildly dilated. Central venous   pressure (est): 3 mm Hg. - Atrial septum: No defect or patent foramen ovale was identified. - Tricuspid valve: There was trivial regurgitation. - Pulmonary arteries: PA peak pressure: 19 mm Hg (S). - Pericardium, extracardiac: There was no pericardial effusion.  Impressions:  - Upper normal LV wall thickness with LVEF 65-70% and grossly   normal diastolic function. MAC with trivial mitral regurgitation.   Moderate left atrial enlargement. Aortic annular calcification.   Device wire noted within the right heart. Mild right atrial   enlargement. Trivial tricuspid regurgitation with PASP 19 mmHg.  Cardiac Catheterization: 12/2014  Prox LAD lesion, 30% stenosed.  Mid LAD lesion, 15% stenosed.  The left ventricular systolic function is normal.   Normal LV function with an ejection fraction of at least 60-65% but with a focal region of apical ballooning.  Mild nonobstructive CAD with smooth 30% ostial LAD stenosis, calcified proximal to mid LAD with 10 -20% narrowing and an LAD which becomes  small caliber in the apical segment without obstructive stenosis; normal left circumflex and normal dominant RCA.  RECOMMENDATION: Medical therapy.  Smoking cessation is imperative.  Aggressive statin therapy.  Beta blocker therapy will be added to his antihypertensive medication regimen.  Assessment:    1. Arrhythmogenic right ventricular dysplasia (HCC)   2. Cardiac device in situ   3. Essential hypertension   4. Tobacco use      Plan:   In order of problems listed above:  1. ARVD/ History of VF Arrest - occurring in 12/2014 with cath showing mild nonobstructive CAD. Cardiac MRI showed ARVD and he underwent ICD placement at that time which has been followed by Dr. Rayann Heman. - denies any recent anginal symptoms. No ICD firings.  - continue BB and ACE-I. He  reports issues with erectile dysfunction since being started on Carvedilol in 2016.  Reviewed the possibility of switching this to Toprol-XL to see if this would help with his symptoms or possibly Bystolic (lower side-effect profile). He will continue on his current medications  at this time and call back if he wishes to switch.  2. HTN - BP initially elevated at 164/94 when checked today, but was significantly improved to 132/82 on recheck. This was checked again in his other arm and was 128/84. Did smoke a cigarette prior to his appointment. He has not been following blood pressure regularly in the ambulatory setting and we reviewed the importance of doing so. He does plan to purchase a blood pressure cuff. He was provided with BP logs to follow his blood pressure for if this remains elevated, would recommend further titration of Lisinopril from 20 mg daily to 40 mg daily. Continue Amlodipine 675m daily, Coreg 6.271mBID, and Clonidine 0.75m33mID.   3. Tobacco Use - He continues to smoke approximately 1 pack/day. Was previously unable to tolerate Chantix and was unsuccessful with nicotine patches. He is planning to switch to vaping with de-escalation of nicotine use through this.   Medication Adjustments/Labs and Tests Ordered: Current medicines are reviewed at length with the patient today.  Concerns regarding medicines are outlined above.  Medication changes, Labs and Tests ordered today are listed in the Patient Instructions below. Patient Instructions  Medication Instructions:  Your physician recommends that you continue on your current medications as directed. Please refer to the Current Medication list given to you today.  Labwork: NONE   Testing/Procedures: NONE   Follow-Up: Your physician recommends that you schedule a follow-up appointment with Dr. AllRayann Hemanny Other Special Instructions Will Be Listed Below (If Applicable).  If you need a refill on your cardiac medications before  your next appointment, please call your pharmacy.  Thank you for choosing [facility]  Signed, BriErma HeritageA-C  10/04/2017 5:22 PM    [facility] [address] Hills[place] [id number]: [phone]

## 2017-10-04 ENCOUNTER — Ambulatory Visit: Payer: BLUE CROSS/BLUE SHIELD | Admitting: Student

## 2017-10-04 ENCOUNTER — Encounter: Payer: Self-pay | Admitting: Student

## 2017-10-04 VITALS — BP 132/82 | HR 72 | Ht 70.0 in | Wt 211.0 lb

## 2017-10-04 DIAGNOSIS — Z959 Presence of cardiac and vascular implant and graft, unspecified: Secondary | ICD-10-CM | POA: Diagnosis not present

## 2017-10-04 DIAGNOSIS — Z720 Tobacco use: Secondary | ICD-10-CM

## 2017-10-04 DIAGNOSIS — I10 Essential (primary) hypertension: Secondary | ICD-10-CM | POA: Diagnosis not present

## 2017-10-04 DIAGNOSIS — I428 Other cardiomyopathies: Secondary | ICD-10-CM

## 2017-10-04 LAB — CUP PACEART REMOTE DEVICE CHECK
HighPow Impedance: 69 Ohm
Implantable Lead Implant Date: 20161018
Implantable Lead Location: 753860
Implantable Pulse Generator Implant Date: 20161018
Lead Channel Pacing Threshold Amplitude: 1.125 V
Lead Channel Sensing Intrinsic Amplitude: 9.75 mV
Lead Channel Sensing Intrinsic Amplitude: 9.75 mV
Lead Channel Setting Sensing Sensitivity: 0.3 mV
MDC IDC MSMT BATTERY REMAINING LONGEVITY: 118 mo
MDC IDC MSMT BATTERY VOLTAGE: 3.01 V
MDC IDC MSMT LEADCHNL RV IMPEDANCE VALUE: 285 Ohm
MDC IDC MSMT LEADCHNL RV IMPEDANCE VALUE: 361 Ohm
MDC IDC MSMT LEADCHNL RV PACING THRESHOLD PULSEWIDTH: 0.4 ms
MDC IDC SESS DTM: 20190604073325
MDC IDC SET LEADCHNL RV PACING AMPLITUDE: 2.5 V
MDC IDC SET LEADCHNL RV PACING PULSEWIDTH: 0.4 ms
MDC IDC STAT BRADY RV PERCENT PACED: 0.01 %

## 2017-10-04 NOTE — Patient Instructions (Signed)
Medication Instructions:  Your physician recommends that you continue on your current medications as directed. Please refer to the Current Medication list given to you today.   Labwork: NONE   Testing/Procedures: NONE   Follow-Up: Your physician recommends that you schedule a follow-up appointment with Dr. Rayann Heman   Any Other Special Instructions Will Be Listed Below (If Applicable).     If you need a refill on your cardiac medications before your next appointment, please call your pharmacy.  Thank you for choosing [facility]!

## 2017-11-27 ENCOUNTER — Ambulatory Visit (INDEPENDENT_AMBULATORY_CARE_PROVIDER_SITE_OTHER): Payer: BLUE CROSS/BLUE SHIELD | Admitting: *Deleted

## 2017-11-27 DIAGNOSIS — I469 Cardiac arrest, cause unspecified: Secondary | ICD-10-CM

## 2017-11-27 NOTE — Progress Notes (Signed)
Remote ICD transmission.   

## 2017-12-19 LAB — CUP PACEART REMOTE DEVICE CHECK
Battery Remaining Longevity: 114 mo
Date Time Interrogation Session: 20190903062603
HIGH POWER IMPEDANCE MEASURED VALUE: 71 Ohm
Implantable Lead Implant Date: 20161018
Lead Channel Impedance Value: 285 Ohm
Lead Channel Pacing Threshold Amplitude: 1.125 V
Lead Channel Sensing Intrinsic Amplitude: 10.5 mV
Lead Channel Sensing Intrinsic Amplitude: 10.5 mV
Lead Channel Setting Sensing Sensitivity: 0.3 mV
MDC IDC LEAD LOCATION: 753860
MDC IDC MSMT BATTERY VOLTAGE: 3.01 V
MDC IDC MSMT LEADCHNL RV IMPEDANCE VALUE: 361 Ohm
MDC IDC MSMT LEADCHNL RV PACING THRESHOLD PULSEWIDTH: 0.4 ms
MDC IDC PG IMPLANT DT: 20161018
MDC IDC SET LEADCHNL RV PACING AMPLITUDE: 2.75 V
MDC IDC SET LEADCHNL RV PACING PULSEWIDTH: 0.4 ms
MDC IDC STAT BRADY RV PERCENT PACED: 0.01 %

## 2017-12-21 ENCOUNTER — Other Ambulatory Visit: Payer: Self-pay | Admitting: Internal Medicine

## 2018-01-24 DIAGNOSIS — N529 Male erectile dysfunction, unspecified: Secondary | ICD-10-CM | POA: Diagnosis not present

## 2018-01-24 DIAGNOSIS — R109 Unspecified abdominal pain: Secondary | ICD-10-CM | POA: Diagnosis not present

## 2018-01-24 DIAGNOSIS — K635 Polyp of colon: Secondary | ICD-10-CM | POA: Diagnosis not present

## 2018-01-24 DIAGNOSIS — I10 Essential (primary) hypertension: Secondary | ICD-10-CM | POA: Diagnosis not present

## 2018-02-25 ENCOUNTER — Other Ambulatory Visit: Payer: Self-pay | Admitting: Internal Medicine

## 2018-02-26 ENCOUNTER — Ambulatory Visit (INDEPENDENT_AMBULATORY_CARE_PROVIDER_SITE_OTHER): Payer: BLUE CROSS/BLUE SHIELD

## 2018-02-26 DIAGNOSIS — I4901 Ventricular fibrillation: Secondary | ICD-10-CM

## 2018-02-26 DIAGNOSIS — I469 Cardiac arrest, cause unspecified: Secondary | ICD-10-CM | POA: Diagnosis not present

## 2018-02-26 NOTE — Progress Notes (Signed)
Remote ICD transmission.   

## 2018-03-05 ENCOUNTER — Encounter: Payer: Self-pay | Admitting: Cardiology

## 2018-03-27 LAB — CUP PACEART REMOTE DEVICE CHECK
Date Time Interrogation Session: 20191203102824
HIGH POWER IMPEDANCE MEASURED VALUE: 69 Ohm
Implantable Lead Implant Date: 20161018
Implantable Lead Location: 753860
Implantable Pulse Generator Implant Date: 20161018
Lead Channel Pacing Threshold Amplitude: 1.25 V
Lead Channel Pacing Threshold Pulse Width: 0.4 ms
Lead Channel Sensing Intrinsic Amplitude: 9.375 mV
Lead Channel Sensing Intrinsic Amplitude: 9.375 mV
Lead Channel Setting Pacing Pulse Width: 0.4 ms
Lead Channel Setting Sensing Sensitivity: 0.3 mV
MDC IDC MSMT BATTERY REMAINING LONGEVITY: 111 mo
MDC IDC MSMT BATTERY VOLTAGE: 3.01 V
MDC IDC MSMT LEADCHNL RV IMPEDANCE VALUE: 285 Ohm
MDC IDC MSMT LEADCHNL RV IMPEDANCE VALUE: 361 Ohm
MDC IDC SET LEADCHNL RV PACING AMPLITUDE: 2.5 V
MDC IDC STAT BRADY RV PERCENT PACED: 0.01 %

## 2018-05-04 IMAGING — CT CT HEAD W/O CM
3 series · 15 of 47 positions shown, 18 images · non-contrast
Comparison: None.

CLINICAL DATA: Sudden onset of dizziness

EXAM:
CT HEAD WITHOUT CONTRAST
TECHNIQUE: Contiguous axial images were obtained from the base of the skull
through the vertex without intravenous contrast.

[Series 2: head wo · axial · 0.48mm/px · z∈[+142,+282]mm · 9 of 34 slices shown, 12 images]
[im 3/34  brain]
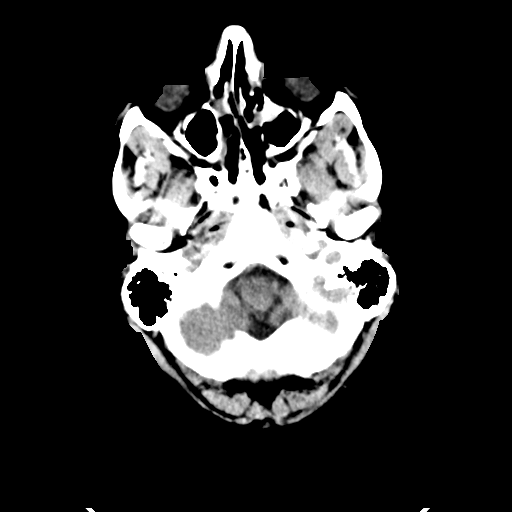
[im 3/34  bone]
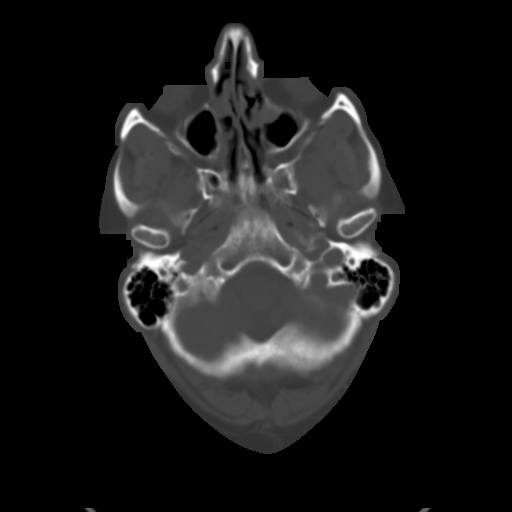
[im 6/34  brain]
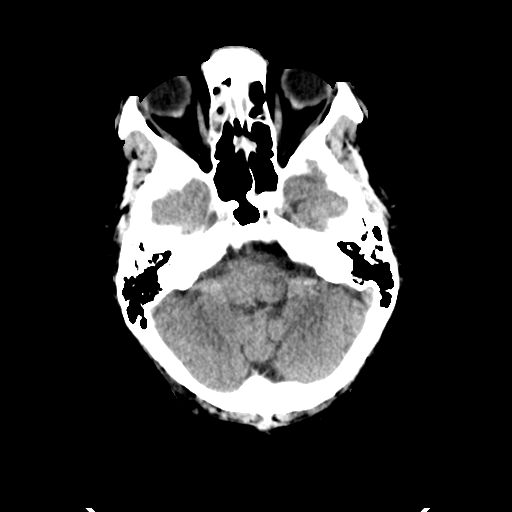
[im 10/34  brain]
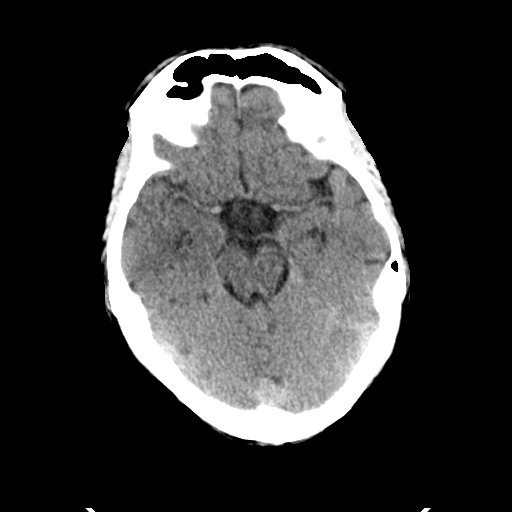
[im 13/34  brain]
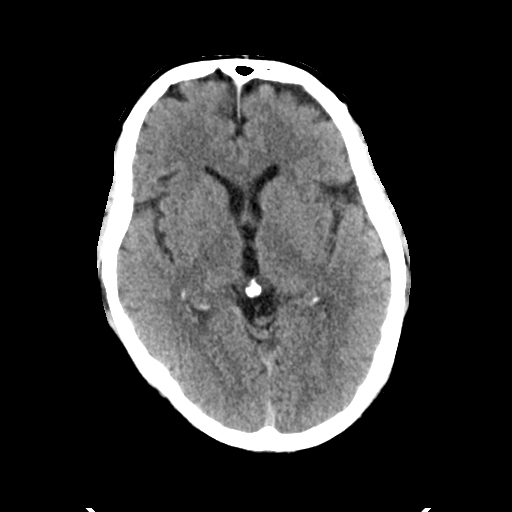
[im 18/34  brain]
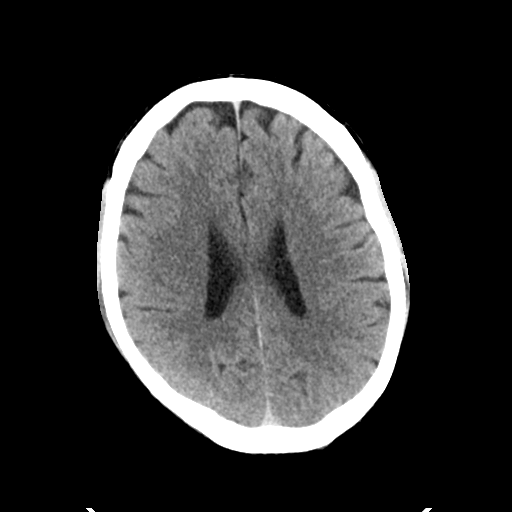
[im 18/34  bone]
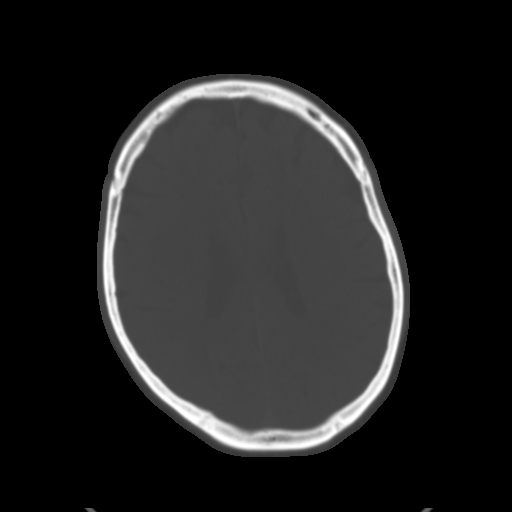
[im 21/34  brain]
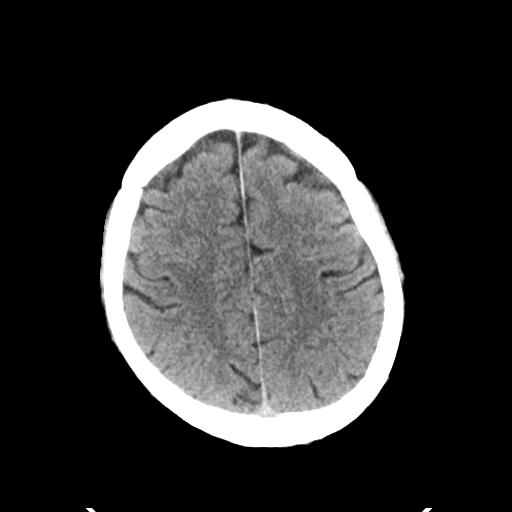
[im 24/34  brain]
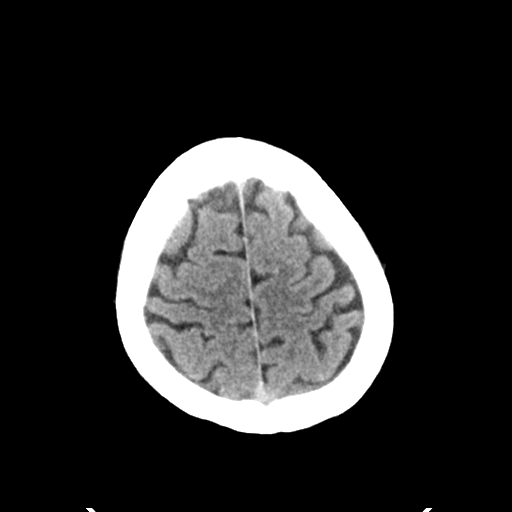
[im 28/34  brain]
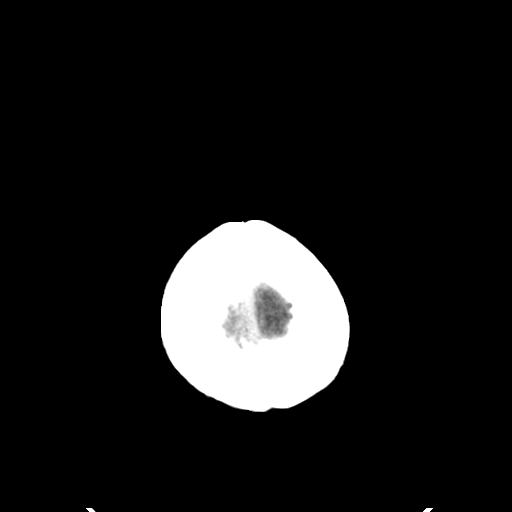
[im 31/34  brain]
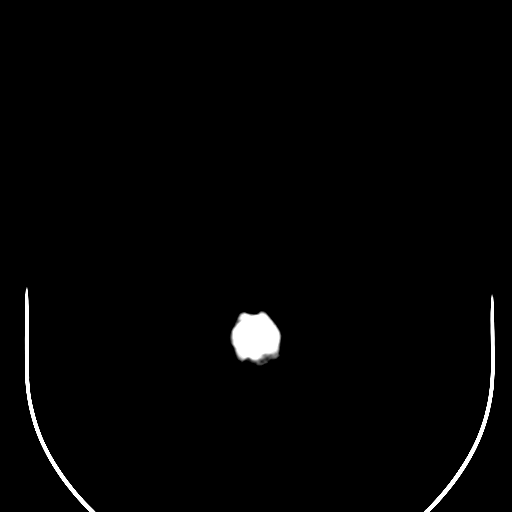
[im 31/34  bone]
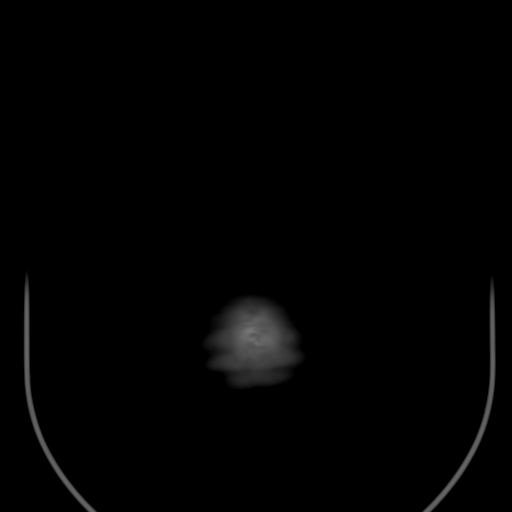

[Series 4: coronal soft tissue · coronal · 0.31mm/px · 3 of 73 slices shown]
[im 25/73  brain]
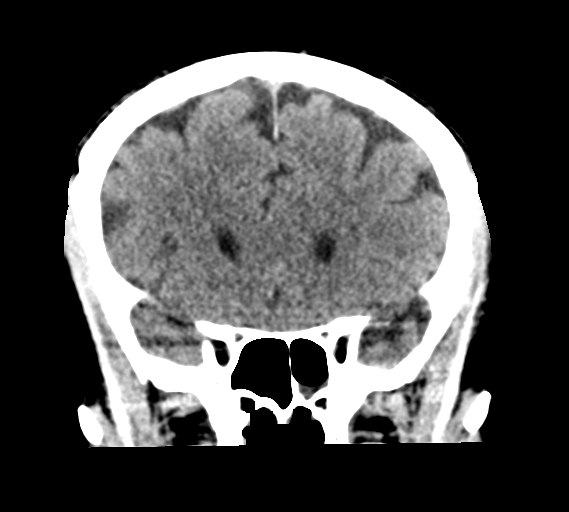
[im 33/73  brain]
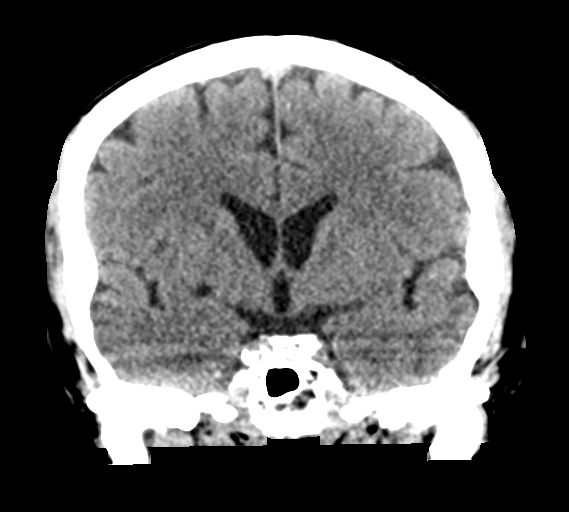
[im 41/73  brain]
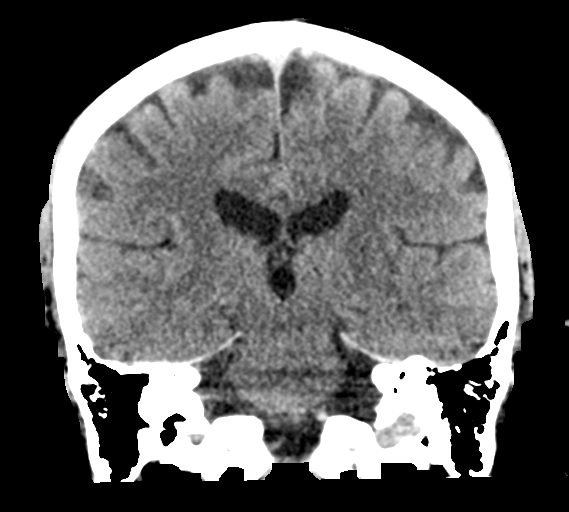

[Series 5: sagittal soft tissue · sagittal · 0.34mm/px · 3 of 64 slices shown]
[im 22/64  brain]
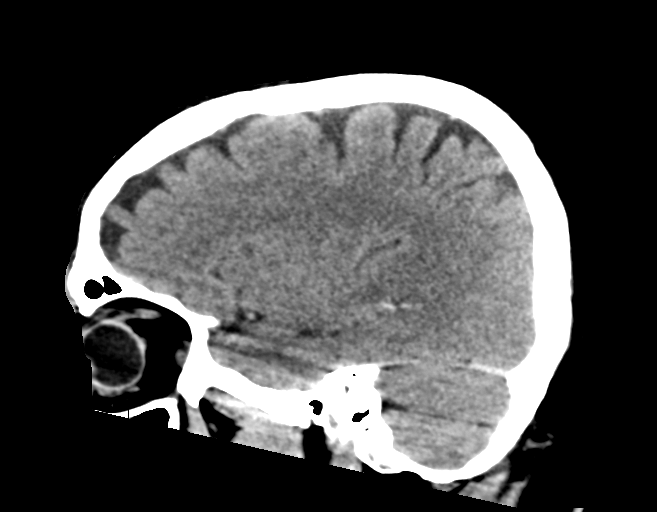
[im 32/64  brain]
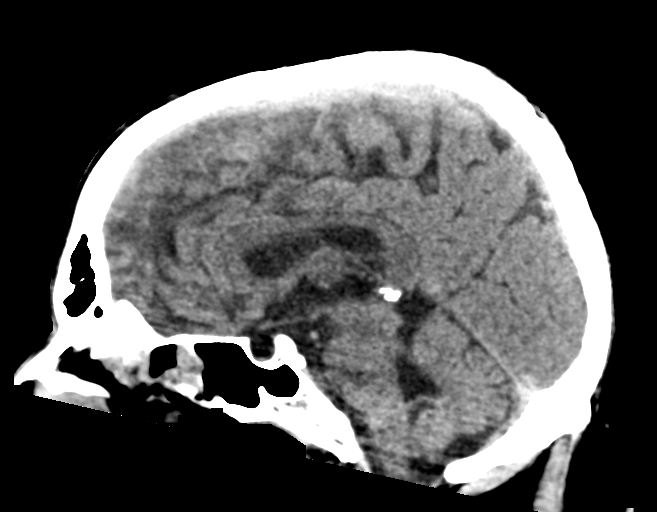
[im 43/64  brain]
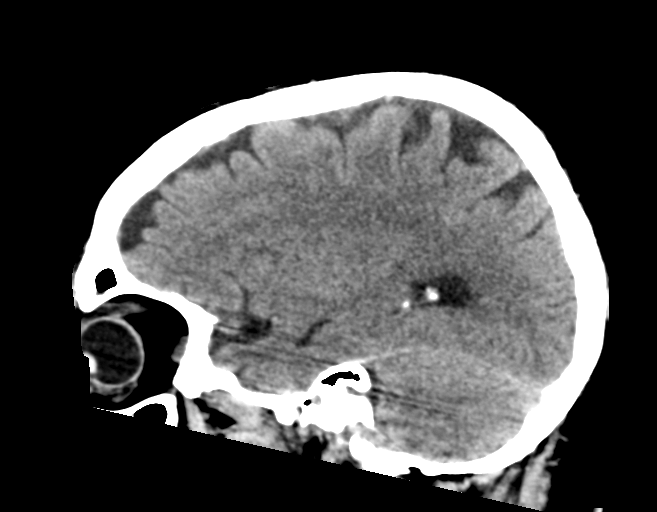

[15 of 47 positions shown; findings below may reference images not displayed]

FINDINGS: Brain: Is there is no mass effect, midline shift, or acute
hemorrhage. Mild global atrophy is noted. Dilated perivascular space
in the right basal ganglia.

Vascular: No hyperdense vessel or unexpected calcification.

Skull: Cranium is intact.

Sinuses/Orbits: Polypoid mucosal thickening in the maxillary
sinuses. There is extensive opacification in the ethmoid air cells.
Mild mucosal thickening in the frontal sinuses and sphenoid sinuses.
Trace fluid in the left mastoid air cells.

Other: Noncontributory.
IMPRESSION: There are inflammatory changes in the paranasal sinuses and left
mastoid air cells.

No acute intracranial pathology.

## 2018-05-15 DIAGNOSIS — D485 Neoplasm of uncertain behavior of skin: Secondary | ICD-10-CM | POA: Diagnosis not present

## 2018-05-15 DIAGNOSIS — L820 Inflamed seborrheic keratosis: Secondary | ICD-10-CM | POA: Diagnosis not present

## 2018-05-15 DIAGNOSIS — L728 Other follicular cysts of the skin and subcutaneous tissue: Secondary | ICD-10-CM | POA: Diagnosis not present

## 2018-05-28 ENCOUNTER — Ambulatory Visit (INDEPENDENT_AMBULATORY_CARE_PROVIDER_SITE_OTHER): Payer: BLUE CROSS/BLUE SHIELD | Admitting: *Deleted

## 2018-05-28 DIAGNOSIS — I469 Cardiac arrest, cause unspecified: Secondary | ICD-10-CM | POA: Diagnosis not present

## 2018-05-29 LAB — CUP PACEART REMOTE DEVICE CHECK
Battery Remaining Longevity: 107 mo
HighPow Impedance: 78 Ohm
Implantable Lead Location: 753860
Implantable Pulse Generator Implant Date: 20161018
Lead Channel Impedance Value: 304 Ohm
Lead Channel Impedance Value: 399 Ohm
Lead Channel Pacing Threshold Amplitude: 1.25 V
Lead Channel Setting Pacing Pulse Width: 0.4 ms
Lead Channel Setting Sensing Sensitivity: 0.3 mV
MDC IDC LEAD IMPLANT DT: 20161018
MDC IDC MSMT BATTERY VOLTAGE: 3 V
MDC IDC MSMT LEADCHNL RV PACING THRESHOLD PULSEWIDTH: 0.4 ms
MDC IDC MSMT LEADCHNL RV SENSING INTR AMPL: 6.875 mV
MDC IDC MSMT LEADCHNL RV SENSING INTR AMPL: 6.875 mV
MDC IDC SESS DTM: 20200303062505
MDC IDC SET LEADCHNL RV PACING AMPLITUDE: 2.5 V
MDC IDC STAT BRADY RV PERCENT PACED: 0.01 %

## 2018-06-05 NOTE — Progress Notes (Signed)
Remote ICD transmission.   

## 2018-06-27 ENCOUNTER — Telehealth: Payer: Self-pay | Admitting: Internal Medicine

## 2018-06-27 NOTE — Telephone Encounter (Signed)
New Message   Patient states he needs a work note since he is a high risk patient.

## 2018-06-27 NOTE — Telephone Encounter (Signed)
I will defer this to Dr.Allred, as our providers here have not been writing work notes. We have had patients speak with their primary care providers.

## 2018-07-02 NOTE — Telephone Encounter (Signed)
RE: work note  Received: Today  Message Contents  Thompson Grayer, MD  Bernita Raisin, RN        I agree.  I think that CDC guidance and recommendations for the communithy should cover him.   Previous Messages      Patient notified

## 2018-07-25 ENCOUNTER — Telehealth: Payer: Self-pay

## 2018-07-25 NOTE — Telephone Encounter (Signed)
Left message regarding appt on 07/26/18. 

## 2018-07-26 ENCOUNTER — Encounter: Payer: BLUE CROSS/BLUE SHIELD | Admitting: Internal Medicine

## 2018-07-30 DIAGNOSIS — I10 Essential (primary) hypertension: Secondary | ICD-10-CM | POA: Diagnosis not present

## 2018-07-30 DIAGNOSIS — N529 Male erectile dysfunction, unspecified: Secondary | ICD-10-CM | POA: Diagnosis not present

## 2018-07-30 DIAGNOSIS — E782 Mixed hyperlipidemia: Secondary | ICD-10-CM | POA: Diagnosis not present

## 2018-08-27 ENCOUNTER — Ambulatory Visit (INDEPENDENT_AMBULATORY_CARE_PROVIDER_SITE_OTHER): Payer: BC Managed Care – PPO | Admitting: *Deleted

## 2018-08-27 DIAGNOSIS — I4901 Ventricular fibrillation: Secondary | ICD-10-CM

## 2018-08-27 DIAGNOSIS — I469 Cardiac arrest, cause unspecified: Secondary | ICD-10-CM

## 2018-08-28 ENCOUNTER — Telehealth: Payer: Self-pay

## 2018-08-28 NOTE — Telephone Encounter (Signed)
Spoke with patient to remind of missed remote transmission 

## 2018-08-30 LAB — CUP PACEART REMOTE DEVICE CHECK
Battery Remaining Longevity: 104 mo
Battery Voltage: 2.98 V
Brady Statistic RV Percent Paced: 0.01 %
Date Time Interrogation Session: 20200605010052
HighPow Impedance: 76 Ohm
Implantable Lead Implant Date: 20161018
Implantable Lead Location: 753860
Implantable Pulse Generator Implant Date: 20161018
Lead Channel Impedance Value: 285 Ohm
Lead Channel Impedance Value: 399 Ohm
Lead Channel Pacing Threshold Amplitude: 1.375 V
Lead Channel Pacing Threshold Pulse Width: 0.4 ms
Lead Channel Sensing Intrinsic Amplitude: 7 mV
Lead Channel Sensing Intrinsic Amplitude: 7 mV
Lead Channel Setting Pacing Amplitude: 2.75 V
Lead Channel Setting Pacing Pulse Width: 0.4 ms
Lead Channel Setting Sensing Sensitivity: 0.3 mV

## 2018-09-04 ENCOUNTER — Encounter: Payer: Self-pay | Admitting: Cardiology

## 2018-09-04 NOTE — Progress Notes (Signed)
Remote ICD transmission.   

## 2018-10-21 DIAGNOSIS — I10 Essential (primary) hypertension: Secondary | ICD-10-CM | POA: Diagnosis not present

## 2018-10-21 DIAGNOSIS — E782 Mixed hyperlipidemia: Secondary | ICD-10-CM | POA: Diagnosis not present

## 2018-10-21 DIAGNOSIS — R7301 Impaired fasting glucose: Secondary | ICD-10-CM | POA: Diagnosis not present

## 2018-10-29 DIAGNOSIS — I10 Essential (primary) hypertension: Secondary | ICD-10-CM | POA: Diagnosis not present

## 2018-10-29 DIAGNOSIS — R945 Abnormal results of liver function studies: Secondary | ICD-10-CM | POA: Diagnosis not present

## 2018-10-29 DIAGNOSIS — E782 Mixed hyperlipidemia: Secondary | ICD-10-CM | POA: Diagnosis not present

## 2018-10-29 DIAGNOSIS — Z0001 Encounter for general adult medical examination with abnormal findings: Secondary | ICD-10-CM | POA: Diagnosis not present

## 2018-11-01 ENCOUNTER — Encounter: Payer: BLUE CROSS/BLUE SHIELD | Admitting: Internal Medicine

## 2018-11-05 DIAGNOSIS — Z959 Presence of cardiac and vascular implant and graft, unspecified: Secondary | ICD-10-CM | POA: Diagnosis not present

## 2018-11-05 DIAGNOSIS — I428 Other cardiomyopathies: Secondary | ICD-10-CM | POA: Diagnosis not present

## 2018-11-05 DIAGNOSIS — F10188 Alcohol abuse with other alcohol-induced disorder: Secondary | ICD-10-CM | POA: Diagnosis not present

## 2018-11-05 DIAGNOSIS — I10 Essential (primary) hypertension: Secondary | ICD-10-CM | POA: Diagnosis not present

## 2018-11-06 ENCOUNTER — Encounter (HOSPITAL_COMMUNITY): Payer: Self-pay | Admitting: Emergency Medicine

## 2018-11-06 ENCOUNTER — Emergency Department (HOSPITAL_COMMUNITY)
Admission: EM | Admit: 2018-11-06 | Discharge: 2018-11-07 | Disposition: A | Discharge status: Home / Self Care | Payer: BC Managed Care – PPO | Attending: Emergency Medicine | Admitting: Emergency Medicine

## 2018-11-06 ENCOUNTER — Other Ambulatory Visit: Payer: Self-pay

## 2018-11-06 DIAGNOSIS — I10 Essential (primary) hypertension: Secondary | ICD-10-CM | POA: Diagnosis not present

## 2018-11-06 DIAGNOSIS — I2510 Atherosclerotic heart disease of native coronary artery without angina pectoris: Secondary | ICD-10-CM | POA: Diagnosis not present

## 2018-11-06 DIAGNOSIS — F17210 Nicotine dependence, cigarettes, uncomplicated: Secondary | ICD-10-CM | POA: Diagnosis not present

## 2018-11-06 DIAGNOSIS — Z79899 Other long term (current) drug therapy: Secondary | ICD-10-CM | POA: Insufficient documentation

## 2018-11-06 DIAGNOSIS — R51 Headache: Secondary | ICD-10-CM | POA: Diagnosis not present

## 2018-11-06 DIAGNOSIS — Z95810 Presence of automatic (implantable) cardiac defibrillator: Secondary | ICD-10-CM | POA: Diagnosis not present

## 2018-11-06 LAB — CBC
HCT: 45.6 % (ref 39.0–52.0)
Hemoglobin: 15.9 g/dL (ref 13.0–17.0)
MCH: 32.7 pg (ref 26.0–34.0)
MCHC: 34.9 g/dL (ref 30.0–36.0)
MCV: 93.8 fL (ref 80.0–100.0)
Platelets: 185 10*3/uL (ref 150–400)
RBC: 4.86 MIL/uL (ref 4.22–5.81)
RDW: 11.6 % (ref 11.5–15.5)
WBC: 8.2 10*3/uL (ref 4.0–10.5)
nRBC: 0 % (ref 0.0–0.2)

## 2018-11-06 MED ORDER — CLONIDINE HCL 0.2 MG PO TABS
0.2000 mg | ORAL_TABLET | Freq: Once | ORAL | Status: AC
  Administered 2018-11-06: 0.2 mg via ORAL
  Filled 2018-11-06: qty 1

## 2018-11-06 MED ORDER — HYDRALAZINE HCL 20 MG/ML IJ SOLN
10.0000 mg | Freq: Once | INTRAMUSCULAR | Status: AC
  Administered 2018-11-06: 10 mg via INTRAVENOUS
  Filled 2018-11-06: qty 1

## 2018-11-06 NOTE — ED Triage Notes (Signed)
Patient states his blood pressure was 180/109 at home and is complaining of headache. States they recently changed his blood pressure medication.

## 2018-11-06 NOTE — ED Notes (Signed)
EDP in room  

## 2018-11-06 NOTE — ED Notes (Signed)
Pt ambulated to room 

## 2018-11-07 LAB — BASIC METABOLIC PANEL
Anion gap: 11 (ref 5–15)
BUN: 10 mg/dL (ref 6–20)
CO2: 26 mmol/L (ref 22–32)
Calcium: 9.6 mg/dL (ref 8.9–10.3)
Chloride: 91 mmol/L — ABNORMAL LOW (ref 98–111)
Creatinine, Ser: 0.71 mg/dL (ref 0.61–1.24)
GFR calc Af Amer: >60 mL/min (ref >60)
GFR calc non Af Amer: >60 mL/min (ref >60)
Glucose, Bld: 117 mg/dL — ABNORMAL HIGH (ref 70–99)
Potassium: 3.3 mmol/L — ABNORMAL LOW (ref 3.5–5.1)
Sodium: 128 mmol/L — ABNORMAL LOW (ref 135–145)

## 2018-11-07 NOTE — ED Provider Notes (Signed)
[facility] EMERGENCY DEPARTMENT Provider Note   CSN: [id number] Arrival date & time: 11/06/18  2233    History   Chief Complaint Chief Complaint  Patient presents with   Hypertension    HPI Stephen Hays is a 53 y.o. male.     Patient presents to the emergency department for evaluation of headache and elevated blood pressure.  Patient had most of his blood pressure medications changed 1 week ago.  Since then his pressures have been running high.  Tonight he developed a pounding headache in the back of his head associated with blood pressures of 180/109.  He reports that the headache he is experiencing is similar to headaches he has had with elevated blood pressure in the past.  No vision change.  No chest pain, shortness of breath, palpitations or syncope.     Past Medical History:  Diagnosis Date   CAD in native artery 12/2014   nonobstructive by cath   Complication of anesthesia    Hypertension    parotid mass    Right ventricular dysplasia    Tobacco abuse    Ventricular fibrillation (HCC)    RV dysplasia on MR  MDT ICD Oct 2016 (Dr. Rayann Heman)    Patient Active Problem List   Diagnosis Date Noted   Encounter for screening colonoscopy 04/20/2017   Elevated LFTs 04/20/2017   Right ventricular dysplasia 04/30/2015   Ventricular fibrillation ([place]) 01/11/2015   CAD (coronary artery disease) 01/11/2015   Mild left ventricular systolic dysfunction 76/81/1572   Hypomagnesemia 01/11/2015   Right bundle branch block 01/11/2015   Parotid mass 01/08/2015   Cardiac arrest ([place])    Hypotension    Hyponatremia    Tobacco use disorder    Alcohol use    Essential hypertension     Past Surgical History:  Procedure Laterality Date   BACK SURGERY     CARDIAC CATHETERIZATION N/A 01/08/2015   Procedure: Left Heart Cath and Coronary Angiography;  Surgeon: Troy Sine, MD;  Location: [facility];  Service: Cardiovascular;  Laterality: N/A;     COLONOSCOPY WITH PROPOFOL N/A 05/21/2017   Procedure: COLONOSCOPY WITH PROPOFOL;  Surgeon: Daneil Dolin, MD;  [facility];  Service: Endoscopy;  Laterality: N/A;  9:30am   EP IMPLANTABLE DEVICE N/A 01/12/2015   Medtronic Visa AF MRI compatible ICD implanted by Dr Rayann Heman for secondary prevention of sudden death   INCISION AND DRAINAGE     on back near buttocks   PAROTIDECTOMY Right 01/08/2015   Procedure: ABORTED PAROTIDECTOMY;  Surgeon: Ruby Cola, MD;  Location: [facility];  Service: ENT;  Laterality: Right;   POLYPECTOMY  05/21/2017   Procedure: POLYPECTOMY;  Surgeon: Daneil Dolin, MD;  [facility];  Service: Endoscopy;;  colon        Home Medications    Prior to Admission medications   Medication Sig Start Date End Date Taking? Authorizing Provider  amLODipine (NORVASC) 10 MG tablet TAKE 1 TABLET BY MOUTH EVERY DAY 02/25/18   Allred, Jeneen Rinks, MD  carvedilol (COREG) 6.25 MG tablet TAKE 1 TABLET (6.25 MG TOTAL) BY MOUTH 2 (TWO) TIMES DAILY. 02/01/17   Allred, Jeneen Rinks, MD  cloNIDine (CATAPRES) 0.2 MG tablet Take 0.2 mg by mouth 2 (two) times daily.     [provider]  lisinopril (PRINIVIL,ZESTRIL) 20 MG tablet TAKE 1 TABLET BY MOUTH DAILY 12/21/17   Thompson Grayer, MD    Family History Family History  Problem Relation Age of Onset  Hypertension Mother    COPD Mother    Cancer - Lung Father    Stroke Sister    Stroke Brother    Colon cancer Paternal Grandmother        metastatic    Colon polyps Neg Hx     Social History Social History   Tobacco Use   Smoking status: Current Every Day Smoker    Packs/day: 1.00    Years: 30.00    Pack years: 30.00   Smokeless tobacco: Never Used   Tobacco comment: trying cut back  Substance Use Topics   Alcohol use: Yes    Alcohol/week: 6.0 - 7.0 standard drinks    Types: 6 - 7 Cans of beer per week    Comment: 3-4 cans of beer per day, chronic    Drug use: No     Allergies    Doxycycline and Prednisone   Review of Systems Review of Systems  Neurological: Positive for headaches.  All other systems reviewed and are negative.    Physical Exam Updated Vital Signs BP (!) 131/91    Pulse (!) 57    Temp 97.9 F (36.6 C) (Oral)    Resp 18    Ht 5\' 10"  (1.778 m)    Wt 95.3 kg    SpO2 99%    BMI 30.13 kg/m   Physical Exam Vitals signs and nursing note reviewed.  Constitutional:      General: He is not in acute distress.    Appearance: Normal appearance. He is well-developed.  HENT:     Head: Normocephalic and atraumatic.     Right Ear: Hearing normal.     Left Ear: Hearing normal.     Nose: Nose normal.  Eyes:     Conjunctiva/sclera: Conjunctivae normal.     Pupils: Pupils are equal, round, and reactive to light.  Neck:     Musculoskeletal: Normal range of motion and neck supple.  Cardiovascular:     Rate and Rhythm: Regular rhythm.     Heart sounds: S1 normal and S2 normal. No murmur. No friction rub. No gallop.   Pulmonary:     Effort: Pulmonary effort is normal. No respiratory distress.     Breath sounds: Normal breath sounds.  Chest:     Chest wall: No tenderness.  Abdominal:     General: Bowel sounds are normal.     Palpations: Abdomen is soft.     Tenderness: There is no abdominal tenderness. There is no guarding or rebound. Negative signs include Murphy's sign and McBurney's sign.     Hernia: No hernia is present.  Musculoskeletal: Normal range of motion.  Skin:    General: Skin is warm and dry.     Findings: No rash.  Neurological:     Mental Status: He is alert and oriented to person, place, and time.     GCS: GCS eye subscore is 4. GCS verbal subscore is 5. GCS motor subscore is 6.     Cranial Nerves: No cranial nerve deficit.     Sensory: No sensory deficit.     Coordination: Coordination normal.  Psychiatric:        Speech: Speech normal.        Behavior: Behavior normal.        Thought Content: Thought content normal.       ED Treatments / Results  Labs (all labs ordered are listed, but only abnormal results are displayed) Labs Reviewed  BASIC METABOLIC PANEL - Abnormal; Notable  for the following components:      Result Value   Sodium 128 (*)    Potassium 3.3 (*)    Chloride 91 (*)    Glucose, Bld 117 (*)    All other components within normal limits  CBC    EKG None  Radiology No results found.  Procedures Procedures (including critical care time)  Medications Ordered in ED Medications  hydrALAZINE (APRESOLINE) injection 10 mg (10 mg Intravenous Given 11/06/18 2351)  cloNIDine (CATAPRES) tablet 0.2 mg (0.2 mg Oral Given 11/06/18 2351)     Initial Impression / Assessment and Plan / ED Course  I have reviewed the triage vital signs and the nursing notes.  Pertinent labs & imaging results that were available during my care of the patient were reviewed by me and considered in my medical decision making (see chart for details).        Patient presents to the ER for evaluation of headache and elevated blood pressure.  He has been monitoring his pressures at home since blood pressure medication changes and he has been running high.  Patient experiencing a throbbing headache that is similar to previous headaches he has had when his blood pressure is elevated.  This is not an unusual headache for him and does not require work-up.  Blood pressure treated with a combination of hydralazine and clonidine with significant improvement.  Headache has resolved as well.  Patient appears well, normal neurologic evaluation on exam.  Will follow up with his doctor tomorrow for further instructions.  Will increase his use of clonidine up to 3 times daily as needed until follow-up.  Final Clinical Impressions(s) / ED Diagnoses   Final diagnoses:  Essential hypertension    ED Discharge Orders    None       Honey Zakarian, Gwenyth Allegra, MD 11/07/18 [phone]

## 2018-11-07 NOTE — Discharge Instructions (Signed)
You can take your clonidine up to 3 times a day as needed for elevated pressure.  Please contact your doctor tomorrow to schedule a visit to review your medications.

## 2018-11-08 DIAGNOSIS — I428 Other cardiomyopathies: Secondary | ICD-10-CM | POA: Diagnosis not present

## 2018-11-08 DIAGNOSIS — I10 Essential (primary) hypertension: Secondary | ICD-10-CM | POA: Diagnosis not present

## 2018-11-08 DIAGNOSIS — Z959 Presence of cardiac and vascular implant and graft, unspecified: Secondary | ICD-10-CM | POA: Diagnosis not present

## 2018-11-22 ENCOUNTER — Other Ambulatory Visit: Payer: Self-pay | Admitting: Internal Medicine

## 2018-11-26 ENCOUNTER — Encounter: Payer: BC Managed Care – PPO | Admitting: *Deleted

## 2018-12-20 ENCOUNTER — Ambulatory Visit (INDEPENDENT_AMBULATORY_CARE_PROVIDER_SITE_OTHER): Payer: BC Managed Care – PPO | Admitting: *Deleted

## 2018-12-20 DIAGNOSIS — I469 Cardiac arrest, cause unspecified: Secondary | ICD-10-CM

## 2018-12-20 DIAGNOSIS — I4901 Ventricular fibrillation: Secondary | ICD-10-CM

## 2018-12-23 ENCOUNTER — Telehealth: Payer: Self-pay | Admitting: Internal Medicine

## 2018-12-23 LAB — CUP PACEART REMOTE DEVICE CHECK
Battery Remaining Longevity: 99 mo
Battery Voltage: 3 V
Brady Statistic RV Percent Paced: 0.01 %
Date Time Interrogation Session: 20200927015815
HighPow Impedance: 78 Ohm
Implantable Lead Implant Date: 20161018
Implantable Lead Location: 753860
Implantable Pulse Generator Implant Date: 20161018
Lead Channel Impedance Value: 285 Ohm
Lead Channel Impedance Value: 399 Ohm
Lead Channel Pacing Threshold Amplitude: 1.25 V
Lead Channel Pacing Threshold Pulse Width: 0.4 ms
Lead Channel Sensing Intrinsic Amplitude: 9 mV
Lead Channel Sensing Intrinsic Amplitude: 9 mV
Lead Channel Setting Pacing Amplitude: 2.5 V
Lead Channel Setting Pacing Pulse Width: 0.4 ms
Lead Channel Setting Sensing Sensitivity: 0.3 mV

## 2018-12-23 NOTE — Telephone Encounter (Signed)
Stated pmd recently changed his BP medications - did not work & caused him to have to go to ED.  ED visit was on 11/06/2018 at [facility].  Stated that ED got his BP back down, but he does not feel like the pmd is working.  Is requesting that we manage, but looks like he basically follows with Dr. Rayann Heman.    Did see Bernerd Pho, [place] 10/04/2017 - will send message to her for advice on how to best manage this patient.    Running 150/100 on average  HR stays 60-70.

## 2018-12-23 NOTE — Telephone Encounter (Signed)
Patient having issues with his blood pressure

## 2018-12-24 ENCOUNTER — Encounter: Payer: Self-pay | Admitting: Cardiology

## 2018-12-24 NOTE — Progress Notes (Signed)
Remote ICD transmission.   

## 2018-12-24 NOTE — Telephone Encounter (Signed)
    By review of medications, he was previously on Amlodipine 10 mg daily, Coreg 6.25 mg twice daily, Clonidine 0.2mg  BID and Lisinopril 20mg  daily. EDP notes mention he was still on Lisinopril but current medication list says Telmisartan. Can we confirm which one he is actually on and if it was switched by his PCP, please see if he knows why.   Either way, can titrate Coreg to 12.5mg  BID. Follow HR and BP with this adjustment. Please arrange follow-up within the next 3-4 weeks since he has not been evaluated since 09/2017. Has scheduled follow-up with Dr. Rayann Heman in 01/2019.  Signed, Erma Heritage, PA-C 12/24/2018, 8:55 AM Pager: [phone]

## 2018-12-27 ENCOUNTER — Encounter: Payer: BC Managed Care – PPO | Admitting: Internal Medicine

## 2018-12-27 NOTE — Telephone Encounter (Signed)
Detailed message left to advise him to increase his Coreg to 12.5mg  twice a day & call back on Monday to discuss getting follow up scheduled.

## 2018-12-30 NOTE — Telephone Encounter (Signed)
Patient notified & did receive the message on Friday.  OV scheduled for 01/20/19 with Estella Husk, PA in [place] office

## 2019-01-08 NOTE — Progress Notes (Deleted)
Cardiology Office Note    Date:  01/08/2019   ID:  Ikie, Caraway Jun 15, 1965, MRN [id number]  PCP:  Celene Squibb, MD  Cardiologist: No primary care provider on file. EPS: Thompson Grayer, MD  No chief complaint on file.   History of Present Illness:  Stephen Hays is a 53 y.o. male with past medical history of VF arrest (occurring in 12/2014 with cath showing mild nonobstructive CAD), ARVD (diagnosed by MRI in 12/2014, s/p Medtronic ICD placement), HTN, and tobacco.  Patient was last seen in our office 10/04/2017 by Bernerd Pho, PA-C was having elevated blood pressures.Has had erectile dysfunction on higher doses of coreg.  Was in the ED 11/07/2018 with elevated blood pressure 180/109 associated with headache   Past Medical History:  Diagnosis Date  . CAD in native artery 12/2014   nonobstructive by cath  . Complication of anesthesia   . Hypertension   . parotid mass   . Right ventricular dysplasia   . Tobacco abuse   . 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 Rayann Heman)    Past Surgical History:  Procedure Laterality Date  . BACK SURGERY    . CARDIAC CATHETERIZATION N/A 01/08/2015   Procedure: Left Heart Cath and Coronary Angiography;  Surgeon: Troy Sine, MD;  Location: [facility] [facility];  Service: Cardiovascular;  Laterality: N/A;  . COLONOSCOPY WITH PROPOFOL N/A 05/21/2017   Procedure: COLONOSCOPY WITH PROPOFOL;  Surgeon: Daneil Dolin, MD;  [facility];  Service: Endoscopy;  Laterality: N/A;  9:30am  . EP IMPLANTABLE DEVICE N/A 01/12/2015   Medtronic Visa AF MRI compatible ICD implanted by Dr Rayann Heman for secondary prevention of sudden death  . INCISION AND DRAINAGE     on back near buttocks  . PAROTIDECTOMY Right 01/08/2015   Procedure: ABORTED PAROTIDECTOMY;  Surgeon: Ruby Cola, MD;  Location: [facility];  Service: ENT;  Laterality: Right;  . POLYPECTOMY  05/21/2017   Procedure: POLYPECTOMY;  Surgeon:  Daneil Dolin, MD;  [facility];  Service: Endoscopy;;  colon    Current Medications: No outpatient medications have been marked as taking for the 01/20/19 encounter (Appointment) with Imogene Burn, PA-C.     Allergies:   Doxycycline and Prednisone   Social History   Socioeconomic History  . Marital status: Single    Spouse name: Not on file  . Number of children: Not on file  . Years of education: Not on file  . Highest education level: Not on file  Occupational History  . Not on file  Social Needs  . Financial resource strain: Not on file  . Food insecurity    Worry: Not on file    Inability: Not on file  . Transportation needs    Medical: Not on file    Non-medical: Not on file  Tobacco Use  . Smoking status: Current Every Day Smoker    Packs/day: 1.00    Years: 30.00    Pack years: 30.00  . Smokeless tobacco: Never Used  . Tobacco comment: trying cut back  Substance and Sexual Activity  . Alcohol use: Yes    Alcohol/week: 6.0 - 7.0 standard drinks    Types: 6 - 7 Cans of beer per week    Comment: 3-4 cans of beer per day, chronic   . Drug use: No  . Sexual activity: Not on file  Lifestyle  . Physical activity  Days per week: Not on file    Minutes per session: Not on file  . Stress: Not on file  Relationships  . Social Herbalist on phone: Not on file    Gets together: Not on file    Attends religious service: Not on file    Active member of club or organization: Not on file    Attends meetings of clubs or organizations: Not on file    Relationship status: Not on file  Other Topics Concern  . Not on file  Social History Narrative  . Not on file     Family History:  The patient's ***family history includes COPD in his mother; Cancer - Lung in his father; Colon cancer in his paternal grandmother; Hypertension in his mother; Stroke in his brother and sister.   ROS:   Please see the history of present illness.    ROS All  other systems reviewed and are negative.   PHYSICAL EXAM:   VS:  There were no vitals taken for this visit.  Physical Exam  GEN: Well nourished, well developed, in no acute distress  HEENT: normal  Neck: no JVD, carotid bruits, or masses Cardiac:RRR; no murmurs, rubs, or gallops  Respiratory:  clear to auscultation bilaterally, normal work of breathing GI: soft, nontender, nondistended, + BS Ext: without cyanosis, clubbing, or edema, Good distal pulses bilaterally MS: no deformity or atrophy  Skin: warm and dry, no rash Neuro:  Alert and Oriented x 3, Strength and sensation are intact Psych: euthymic mood, full affect  Wt Readings from Last 3 Encounters:  11/06/18 210 lb (95.3 kg)  10/04/17 211 lb (95.7 kg)  07/27/17 216 lb 12.8 oz (98.3 kg)      Studies/Labs Reviewed:   EKG:  EKG is*** ordered today.  The ekg ordered today demonstrates ***  Recent Labs: 11/06/2018: BUN 10; Creatinine, Ser 0.71; Hemoglobin 15.9; Platelets 185; Potassium 3.3; Sodium 128   Lipid Panel No results found for: CHOL, TRIG, HDL, CHOLHDL, VLDL, LDLCALC, LDLDIRECT  Additional studies/ records that were reviewed today include:  ***    ASSESSMENT:    No diagnosis found.   PLAN:  In order of problems listed above:      Medication Adjustments/Labs and Tests Ordered: Current medicines are reviewed at length with the patient today.  Concerns regarding medicines are outlined above.  Medication changes, Labs and Tests ordered today are listed in the Patient Instructions below. There are no Patient Instructions on file for this visit.   Sumner Boast, PA-C  01/08/2019 2:07 PM    [facility] [place], [place], [place]  [address] Phone: [phone]; Fax: [phone]

## 2019-01-20 ENCOUNTER — Ambulatory Visit: Payer: BC Managed Care – PPO | Admitting: Cardiology

## 2019-02-19 ENCOUNTER — Encounter: Payer: Self-pay | Admitting: Internal Medicine

## 2019-02-19 ENCOUNTER — Telehealth (INDEPENDENT_AMBULATORY_CARE_PROVIDER_SITE_OTHER): Payer: BC Managed Care – PPO | Admitting: Internal Medicine

## 2019-02-19 VITALS — BP 130/84 | HR 71 | Ht 71.0 in | Wt 210.0 lb

## 2019-02-19 DIAGNOSIS — Z720 Tobacco use: Secondary | ICD-10-CM | POA: Diagnosis not present

## 2019-02-19 DIAGNOSIS — I428 Other cardiomyopathies: Secondary | ICD-10-CM | POA: Diagnosis not present

## 2019-02-19 DIAGNOSIS — I119 Hypertensive heart disease without heart failure: Secondary | ICD-10-CM | POA: Diagnosis not present

## 2019-02-19 NOTE — Progress Notes (Signed)
Electrophysiology TeleHealth Note    Due to national recommendations of social distancing due to [place] 19, an audio telehealth visit is felt to be most appropriate for this patient at this time.  Verbal consent was obtained by me for the telehealth visit today.  The patient does not have capability for a virtual visit.  A phone visit is therefore required today.   Date:  02/19/2019   ID:  Stephen Hays, DOB 1965-07-03, MRN [id number]  Location: patient's home  Provider location:  [facility]  Evaluation Performed: Follow-up visit  PCP:  Celene Squibb, MD   Electrophysiologist:  Dr Rayann Heman  Chief Complaint:  ICD follow up  History of Present Illness:    Stephen Hays is a 53 y.o. male who presents via telehealth conferencing today.  Since last being seen in our clinic, the patient reports doing very well.  Today, he denies symptoms of palpitations, chest pain, shortness of breath,  lower extremity edema, dizziness, presyncope, or syncope.  The patient is otherwise without complaint today.  The patient denies symptoms of fevers, chills, cough, or new SOB worrisome for COVID 19.  Past Medical History:  Diagnosis Date  . CAD in native artery 12/2014   nonobstructive by cath  . Complication of anesthesia   . Hypertension   . parotid mass   . Right ventricular dysplasia   . Tobacco abuse   . 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 Rayann Heman)    Past Surgical History:  Procedure Laterality Date  . BACK SURGERY    . CARDIAC CATHETERIZATION N/A 01/08/2015   Procedure: Left Heart Cath and Coronary Angiography;  Surgeon: Troy Sine, MD;  Location: [place] [facility];  Service: Cardiovascular;  Laterality: N/A;  . COLONOSCOPY WITH PROPOFOL N/A 05/21/2017   Procedure: COLONOSCOPY WITH PROPOFOL;  Surgeon: Daneil Dolin, MD;  [facility];  Service: Endoscopy;  Laterality: N/A;  9:30am  . EP IMPLANTABLE DEVICE N/A 01/12/2015   Medtronic Visa AF MRI compatible ICD implanted by Dr Rayann Heman for secondary prevention of sudden death  . INCISION AND DRAINAGE     on back near buttocks  . PAROTIDECTOMY Right 01/08/2015   Procedure: ABORTED PAROTIDECTOMY;  Surgeon: Ruby Cola, MD;  Location: [place] [facility];  Service: ENT;  Laterality: Right;  . POLYPECTOMY  05/21/2017   Procedure: POLYPECTOMY;  Surgeon: Daneil Dolin, MD;  [facility];  Service: Endoscopy;;  colon    Current Outpatient Medications  Medication Sig Dispense Refill  . amLODipine (NORVASC) 10 MG tablet TAKE 1 TABLET BY MOUTH EVERY DAY 90 tablet 2  . carvedilol (COREG) 6.25 MG tablet TAKE 1 TABLET (6.25 MG TOTAL) BY MOUTH 2 (TWO) TIMES DAILY. 180 tablet 3  . cloNIDine (CATAPRES) 0.2 MG tablet Take 0.2 mg by mouth 2 (two) times daily.     [name] Kitchen telmisartan (MICARDIS) 80 MG tablet Take 80 mg by mouth daily.     No current facility-administered medications for this visit.     Allergies:   Doxycycline and Prednisone   Social History:  The patient  reports that he has been smoking. He has a 30.00 pack-year smoking history. He has never used smokeless tobacco. He reports current alcohol use of about 6.0 - 7.0 standard drinks of alcohol per week. He reports that he does not use drugs.   Family History:  The patient's  family history includes COPD in his mother;  Cancer - Lung in his father; Colon cancer in his paternal grandmother; Hypertension in his mother; Stroke in his brother and sister.   ROS:  Please see the history of present illness.   All other systems are personally reviewed and negative.    Exam:    Vital Signs:  BP 130/84   Pulse 71   Ht 5\' 11"  (1.803 m)   Wt 210 lb (95.3 kg)   BMI 29.29 kg/m   Well sounding and appearing, alert and conversant, regular work of breathing Labs/Other Tests and Data Reviewed:    Recent Labs: 11/06/2018: BUN 10; Creatinine, Ser 0.71; Hemoglobin 15.9; Platelets 185; Potassium 3.3; Sodium 128   Wt Readings from  Last 3 Encounters:  02/19/19 210 lb (95.3 kg)  11/06/18 210 lb (95.3 kg)  10/04/17 211 lb (95.7 kg)     Last device remote is reviewed from [place] PDF which reveals normal device function, no arrhythmias    ASSESSMENT & PLAN:    1.  ARVD Normal device function No recent arrhythmias See recent PaceArt report  2.  Hypertensive cardiovascular disease Stable No change required today  3.  Tobacco abuse Cessation advised   Follow-up:  [facility], me in 1 year    Patient Risk:  after full review of this patients clinical status, I feel that they are at moderate risk at this time.  Today, I have spent 15 minutes with the patient with telehealth technology discussing arrhythmia management .    Army Fossa, MD  02/19/2019 10:20 AM     [facility] [address] [place] [address] [phone] (office) [phone] (fax)

## 2019-03-24 ENCOUNTER — Ambulatory Visit (INDEPENDENT_AMBULATORY_CARE_PROVIDER_SITE_OTHER): Payer: BC Managed Care – PPO | Admitting: *Deleted

## 2019-03-24 DIAGNOSIS — I469 Cardiac arrest, cause unspecified: Secondary | ICD-10-CM | POA: Diagnosis not present

## 2019-03-25 LAB — CUP PACEART REMOTE DEVICE CHECK
Battery Remaining Longevity: 94 mo
Battery Voltage: 3 V
Brady Statistic RV Percent Paced: 0.01 %
Date Time Interrogation Session: 20201228202731
HighPow Impedance: 80 Ohm
Implantable Lead Implant Date: 20161018
Implantable Lead Location: 753860
Implantable Pulse Generator Implant Date: 20161018
Lead Channel Impedance Value: 304 Ohm
Lead Channel Impedance Value: 399 Ohm
Lead Channel Pacing Threshold Amplitude: 1.25 V
Lead Channel Pacing Threshold Pulse Width: 0.4 ms
Lead Channel Sensing Intrinsic Amplitude: 11.5 mV
Lead Channel Sensing Intrinsic Amplitude: 11.5 mV
Lead Channel Setting Pacing Amplitude: 2.75 V
Lead Channel Setting Pacing Pulse Width: 0.4 ms
Lead Channel Setting Sensing Sensitivity: 0.3 mV

## 2019-04-10 DIAGNOSIS — R519 Headache, unspecified: Secondary | ICD-10-CM | POA: Diagnosis not present

## 2019-04-10 DIAGNOSIS — U071 COVID-19: Secondary | ICD-10-CM | POA: Diagnosis not present

## 2019-04-21 DIAGNOSIS — I10 Essential (primary) hypertension: Secondary | ICD-10-CM | POA: Diagnosis not present

## 2019-04-21 DIAGNOSIS — R739 Hyperglycemia, unspecified: Secondary | ICD-10-CM | POA: Diagnosis not present

## 2019-04-21 DIAGNOSIS — F17210 Nicotine dependence, cigarettes, uncomplicated: Secondary | ICD-10-CM | POA: Diagnosis not present

## 2019-04-21 DIAGNOSIS — Z720 Tobacco use: Secondary | ICD-10-CM | POA: Diagnosis not present

## 2019-04-21 DIAGNOSIS — Z95810 Presence of automatic (implantable) cardiac defibrillator: Secondary | ICD-10-CM | POA: Diagnosis not present

## 2019-05-19 DIAGNOSIS — I10 Essential (primary) hypertension: Secondary | ICD-10-CM | POA: Diagnosis not present

## 2019-05-19 DIAGNOSIS — Z720 Tobacco use: Secondary | ICD-10-CM | POA: Diagnosis not present

## 2019-05-19 DIAGNOSIS — R7303 Prediabetes: Secondary | ICD-10-CM | POA: Diagnosis not present

## 2019-06-23 ENCOUNTER — Ambulatory Visit (INDEPENDENT_AMBULATORY_CARE_PROVIDER_SITE_OTHER): Payer: BC Managed Care – PPO | Admitting: *Deleted

## 2019-06-23 DIAGNOSIS — I469 Cardiac arrest, cause unspecified: Secondary | ICD-10-CM

## 2019-06-23 LAB — CUP PACEART REMOTE DEVICE CHECK
Battery Remaining Longevity: 88 mo
Battery Voltage: 3 V
Brady Statistic RV Percent Paced: 0.01 %
Date Time Interrogation Session: 20210329093626
HighPow Impedance: 64 Ohm
Implantable Lead Implant Date: 20161018
Implantable Lead Location: 753860
Implantable Pulse Generator Implant Date: 20161018
Lead Channel Impedance Value: 285 Ohm
Lead Channel Impedance Value: 361 Ohm
Lead Channel Pacing Threshold Amplitude: 1.25 V
Lead Channel Pacing Threshold Pulse Width: 0.4 ms
Lead Channel Sensing Intrinsic Amplitude: 7.375 mV
Lead Channel Sensing Intrinsic Amplitude: 7.375 mV
Lead Channel Setting Pacing Amplitude: 2.5 V
Lead Channel Setting Pacing Pulse Width: 0.4 ms
Lead Channel Setting Sensing Sensitivity: 0.3 mV

## 2019-06-24 DIAGNOSIS — Z1159 Encounter for screening for other viral diseases: Secondary | ICD-10-CM | POA: Diagnosis not present

## 2019-06-24 DIAGNOSIS — I10 Essential (primary) hypertension: Secondary | ICD-10-CM | POA: Diagnosis not present

## 2019-06-24 NOTE — Progress Notes (Signed)
ICD Remote  

## 2019-08-31 ENCOUNTER — Other Ambulatory Visit: Payer: Self-pay | Admitting: Internal Medicine

## 2019-09-22 ENCOUNTER — Ambulatory Visit (INDEPENDENT_AMBULATORY_CARE_PROVIDER_SITE_OTHER): Payer: BC Managed Care – PPO | Admitting: *Deleted

## 2019-09-22 DIAGNOSIS — I469 Cardiac arrest, cause unspecified: Secondary | ICD-10-CM

## 2019-09-23 LAB — CUP PACEART REMOTE DEVICE CHECK
Battery Remaining Longevity: 90 mo
Battery Voltage: 3 V
Brady Statistic RV Percent Paced: 0.01 %
Date Time Interrogation Session: 20210628033524
HighPow Impedance: 73 Ohm
Implantable Lead Implant Date: 20161018
Implantable Lead Location: 753860
Implantable Pulse Generator Implant Date: 20161018
Lead Channel Impedance Value: 304 Ohm
Lead Channel Impedance Value: 399 Ohm
Lead Channel Pacing Threshold Amplitude: 1.375 V
Lead Channel Pacing Threshold Pulse Width: 0.4 ms
Lead Channel Sensing Intrinsic Amplitude: 8.25 mV
Lead Channel Sensing Intrinsic Amplitude: 8.25 mV
Lead Channel Setting Pacing Amplitude: 3 V
Lead Channel Setting Pacing Pulse Width: 0.4 ms
Lead Channel Setting Sensing Sensitivity: 0.3 mV

## 2019-09-24 NOTE — Progress Notes (Signed)
Remote ICD transmission.   

## 2019-11-24 ENCOUNTER — Telehealth: Payer: Self-pay | Admitting: Internal Medicine

## 2019-11-24 NOTE — Telephone Encounter (Signed)
       I went in pt's chart to see when he need his next app with Dr Rayann Heman.

## 2019-12-22 ENCOUNTER — Ambulatory Visit (INDEPENDENT_AMBULATORY_CARE_PROVIDER_SITE_OTHER): Payer: BC Managed Care – PPO | Admitting: Emergency Medicine

## 2019-12-22 DIAGNOSIS — I469 Cardiac arrest, cause unspecified: Secondary | ICD-10-CM

## 2019-12-22 LAB — CUP PACEART REMOTE DEVICE CHECK
Battery Remaining Longevity: 86 mo
Battery Voltage: 3 V
Brady Statistic RV Percent Paced: 0.01 %
Date Time Interrogation Session: 20210927001705
HighPow Impedance: 82 Ohm
Implantable Lead Implant Date: 20161018
Implantable Lead Location: 753860
Implantable Pulse Generator Implant Date: 20161018
Lead Channel Impedance Value: 342 Ohm
Lead Channel Impedance Value: 399 Ohm
Lead Channel Pacing Threshold Amplitude: 1.25 V
Lead Channel Pacing Threshold Pulse Width: 0.4 ms
Lead Channel Sensing Intrinsic Amplitude: 9.625 mV
Lead Channel Sensing Intrinsic Amplitude: 9.625 mV
Lead Channel Setting Pacing Amplitude: 2.5 V
Lead Channel Setting Pacing Pulse Width: 0.4 ms
Lead Channel Setting Sensing Sensitivity: 0.3 mV

## 2019-12-24 NOTE — Progress Notes (Signed)
Remote ICD transmission.   

## 2020-02-27 ENCOUNTER — Encounter: Payer: BC Managed Care – PPO | Admitting: Internal Medicine

## 2020-03-12 ENCOUNTER — Encounter: Payer: BC Managed Care – PPO | Admitting: Internal Medicine

## 2020-03-22 ENCOUNTER — Ambulatory Visit (INDEPENDENT_AMBULATORY_CARE_PROVIDER_SITE_OTHER): Payer: BC Managed Care – PPO

## 2020-03-22 DIAGNOSIS — I469 Cardiac arrest, cause unspecified: Secondary | ICD-10-CM

## 2020-03-22 LAB — CUP PACEART REMOTE DEVICE CHECK
Battery Remaining Longevity: 81 mo
Battery Voltage: 2.99 V
Brady Statistic RV Percent Paced: 0.01 %
Date Time Interrogation Session: 20211227033424
HighPow Impedance: 78 Ohm
Implantable Lead Implant Date: 20161018
Implantable Lead Location: 753860
Implantable Pulse Generator Implant Date: 20161018
Lead Channel Impedance Value: 285 Ohm
Lead Channel Impedance Value: 399 Ohm
Lead Channel Pacing Threshold Amplitude: 1 V
Lead Channel Pacing Threshold Pulse Width: 0.4 ms
Lead Channel Sensing Intrinsic Amplitude: 8.125 mV
Lead Channel Sensing Intrinsic Amplitude: 8.125 mV
Lead Channel Setting Pacing Amplitude: 2.5 V
Lead Channel Setting Pacing Pulse Width: 0.4 ms
Lead Channel Setting Sensing Sensitivity: 0.3 mV

## 2020-03-24 ENCOUNTER — Encounter: Payer: Self-pay | Admitting: *Deleted

## 2020-04-05 ENCOUNTER — Encounter: Payer: BC Managed Care – PPO | Admitting: Internal Medicine

## 2020-04-05 NOTE — Progress Notes (Signed)
Remote ICD transmission.   

## 2020-06-11 ENCOUNTER — Other Ambulatory Visit: Payer: Self-pay | Admitting: Internal Medicine

## 2020-06-11 NOTE — Telephone Encounter (Signed)
This is a [place] pt.  °

## 2020-06-21 ENCOUNTER — Ambulatory Visit (INDEPENDENT_AMBULATORY_CARE_PROVIDER_SITE_OTHER): Payer: BC Managed Care – PPO

## 2020-06-21 DIAGNOSIS — I469 Cardiac arrest, cause unspecified: Secondary | ICD-10-CM

## 2020-06-21 LAB — CUP PACEART REMOTE DEVICE CHECK
Battery Remaining Longevity: 75 mo
Battery Voltage: 2.99 V
Brady Statistic RV Percent Paced: 0.01 %
Date Time Interrogation Session: 20220328002205
HighPow Impedance: 68 Ohm
Implantable Lead Implant Date: 20161018
Implantable Lead Location: 753860
Implantable Pulse Generator Implant Date: 20161018
Lead Channel Impedance Value: 304 Ohm
Lead Channel Impedance Value: 399 Ohm
Lead Channel Pacing Threshold Amplitude: 1 V
Lead Channel Pacing Threshold Pulse Width: 0.4 ms
Lead Channel Sensing Intrinsic Amplitude: 9.125 mV
Lead Channel Sensing Intrinsic Amplitude: 9.125 mV
Lead Channel Setting Pacing Amplitude: 2.5 V
Lead Channel Setting Pacing Pulse Width: 0.4 ms
Lead Channel Setting Sensing Sensitivity: 0.3 mV

## 2020-06-25 ENCOUNTER — Ambulatory Visit: Payer: BC Managed Care – PPO | Admitting: Internal Medicine

## 2020-06-25 ENCOUNTER — Encounter: Payer: Self-pay | Admitting: Internal Medicine

## 2020-06-25 VITALS — BP 160/100 | HR 74 | Ht 70.0 in | Wt 219.4 lb

## 2020-06-25 DIAGNOSIS — I428 Other cardiomyopathies: Secondary | ICD-10-CM

## 2020-06-25 DIAGNOSIS — N529 Male erectile dysfunction, unspecified: Secondary | ICD-10-CM

## 2020-06-25 DIAGNOSIS — I10 Essential (primary) hypertension: Secondary | ICD-10-CM

## 2020-06-25 MED ORDER — SPIRONOLACTONE 25 MG PO TABS: 25.0000 mg | ORAL_TABLET | Freq: Every day | ORAL | 6 refills | Status: DC

## 2020-06-25 NOTE — Patient Instructions (Addendum)
Medication Instructions:   Stop Coreg (Carvedilol).  Begin Spironolactone 25mg  daily.   Continue all other medications.    Labwork: none  Testing/Procedures: none  Follow-Up: 1 year   Any Other Special Instructions Will Be Listed Below (If Applicable).  If you need a refill on your cardiac medications before your next appointment, please call your pharmacy. s

## 2020-06-25 NOTE — Progress Notes (Signed)
PCP: Celene Squibb, MD   Primary EP: Dr Rayann Heman  Stephen Hays is a 55 y.o. male who presents today for routine electrophysiology followup.  Since last being seen in our clinic, the patient reports doing very well. He is very active.  Continues to work at [employer].  His concern is with elevated BP.  He has fatigue and ED which he attributes to Coreg.   Today, he denies symptoms of palpitations, chest pain, shortness of breath,  lower extremity edema, dizziness, presyncope, syncope, or ICD shocks.  The patient is otherwise without complaint today.   Past Medical History:  Diagnosis Date  . CAD in native artery 12/2014   nonobstructive by cath  . Complication of anesthesia   . Hypertension   . parotid mass   . Right ventricular dysplasia   . Tobacco abuse   . 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 Rayann Heman)   Past Surgical History:  Procedure Laterality Date  . BACK SURGERY    . CARDIAC CATHETERIZATION N/A 01/08/2015   Procedure: Left Heart Cath and Coronary Angiography;  Surgeon: Troy Sine, MD;  Location: [place] [facility];  Service: Cardiovascular;  Laterality: N/A;  . COLONOSCOPY WITH PROPOFOL N/A 05/21/2017   Procedure: COLONOSCOPY WITH PROPOFOL;  Surgeon: Daneil Dolin, MD;  [facility];  Service: Endoscopy;  Laterality: N/A;  9:30am  . EP IMPLANTABLE DEVICE N/A 01/12/2015   Medtronic Visa AF MRI compatible ICD implanted by Dr Rayann Heman for secondary prevention of sudden death  . INCISION AND DRAINAGE     on back near buttocks  . PAROTIDECTOMY Right 01/08/2015   Procedure: ABORTED PAROTIDECTOMY;  Surgeon: Ruby Cola, MD;  Location: [place];  Service: ENT;  Laterality: Right;  . POLYPECTOMY  05/21/2017   Procedure: POLYPECTOMY;  Surgeon: Daneil Dolin, MD;  [facility];  Service: Endoscopy;;  colon    ROS- all systems are reviewed and negative except as per HPI above  Current Outpatient Medications  Medication  Sig Dispense Refill  . amLODipine (NORVASC) 10 MG tablet TAKE 1 TABLET BY MOUTH EVERY DAY 90 tablet 2  . amoxicillin (AMOXIL) 500 MG tablet Take 500 mg by mouth 3 (three) times daily.    . carvedilol (COREG) 6.25 MG tablet TAKE 1 TABLET (6.25 MG TOTAL) BY MOUTH 2 (TWO) TIMES DAILY. 180 tablet 3  . chlorhexidine (PERIDEX) 0.12 % solution 30 mLs by Mouth Rinse route in the morning and at bedtime.    . cloNIDine (CATAPRES) 0.2 MG tablet Take 0.2 mg by mouth 2 (two) times daily.     . hydrochlorothiazide (MICROZIDE) 12.5 MG capsule Take 12.5 mg by mouth daily.    [name] Kitchen telmisartan (MICARDIS) 80 MG tablet Take 80 mg by mouth daily.     No current facility-administered medications for this visit.    Physical Exam: Vitals:   06/25/20 1055  BP: (!) 160/100  Pulse: 74  SpO2: 99%  Weight: 219 lb 6.4 oz (99.5 kg)  Height: 5\' 10"  (1.778 m)    GEN- The patient is well appearing, alert and oriented x 3 today.   Head- normocephalic, atraumatic Eyes-  Sclera clear, conjunctiva pink Ears- hearing intact Oropharynx- clear Lungs-  normal work of breathing Chest- ICD pocket is well healed Heart- Regular rate and rhythm  GI- soft,   Extremities- no clubbing, cyanosis, or edema  ICD interrogation- reviewed in detail today,  See PACEART report  Wt Readings from Last 3 Encounters:  06/25/20 219 lb 6.4 oz (99.5 kg)  02/19/19 210 lb (95.3 kg)  11/06/18 210 lb (95.3 kg)    Assessment and Plan:  1.  ARVD with prior VF arrest Normal ICD function See Pace Art report No changes today he is not device dependant today  2. HTN Elevated frequently Not tolerating coreg Stop coreg Start spironolactone 25mg  daily He has scheduled follow-up next week with PCP.  I have advised BMET at that time and close follow-up of K on spironolactone  3. ED New concern Stop coreg as above  Risks, benefits and potential toxicities for medications prescribed and/or refilled reviewed with patient today.   Return  in a year  Thompson Grayer MD, [facility] 06/25/2020 11:08 AM

## 2020-07-02 NOTE — Progress Notes (Signed)
Remote ICD transmission.   

## 2020-09-01 ENCOUNTER — Other Ambulatory Visit (HOSPITAL_COMMUNITY): Payer: Self-pay | Admitting: Nephrology

## 2020-09-01 DIAGNOSIS — I428 Other cardiomyopathies: Secondary | ICD-10-CM

## 2020-09-10 ENCOUNTER — Ambulatory Visit (INDEPENDENT_AMBULATORY_CARE_PROVIDER_SITE_OTHER)
Admission: RE | Admit: 2020-09-10 | Discharge: 2020-09-10 | Disposition: A | Discharge status: Home / Self Care | Payer: BC Managed Care – PPO | Source: Ambulatory Visit | Attending: Nephrology | Admitting: Nephrology

## 2020-09-10 ENCOUNTER — Other Ambulatory Visit: Payer: Self-pay

## 2020-09-10 ENCOUNTER — Other Ambulatory Visit (HOSPITAL_COMMUNITY): Payer: Self-pay | Admitting: Nephrology

## 2020-09-10 ENCOUNTER — Ambulatory Visit (HOSPITAL_COMMUNITY)
Admission: RE | Admit: 2020-09-10 | Discharge: 2020-09-10 | Disposition: A | Discharge status: Home / Self Care | Payer: BC Managed Care – PPO | Source: Ambulatory Visit | Attending: Nephrology | Admitting: Nephrology

## 2020-09-10 DIAGNOSIS — I428 Other cardiomyopathies: Secondary | ICD-10-CM

## 2020-09-20 ENCOUNTER — Ambulatory Visit (INDEPENDENT_AMBULATORY_CARE_PROVIDER_SITE_OTHER): Payer: BC Managed Care – PPO

## 2020-09-20 DIAGNOSIS — I469 Cardiac arrest, cause unspecified: Secondary | ICD-10-CM | POA: Diagnosis not present

## 2020-09-21 LAB — CUP PACEART REMOTE DEVICE CHECK
Battery Remaining Longevity: 70 mo
Battery Voltage: 2.99 V
Brady Statistic RV Percent Paced: 0.01 %
Date Time Interrogation Session: 20220627022823
HighPow Impedance: 74 Ohm
Implantable Lead Implant Date: 20161018
Implantable Lead Location: 753860
Implantable Pulse Generator Implant Date: 20161018
Lead Channel Impedance Value: 304 Ohm
Lead Channel Impedance Value: 361 Ohm
Lead Channel Pacing Threshold Amplitude: 1.125 V
Lead Channel Pacing Threshold Pulse Width: 0.4 ms
Lead Channel Sensing Intrinsic Amplitude: 7.5 mV
Lead Channel Sensing Intrinsic Amplitude: 7.5 mV
Lead Channel Setting Pacing Amplitude: 2.5 V
Lead Channel Setting Pacing Pulse Width: 0.4 ms
Lead Channel Setting Sensing Sensitivity: 0.3 mV

## 2020-10-08 NOTE — Progress Notes (Signed)
Remote ICD transmission.   

## 2020-11-24 ENCOUNTER — Other Ambulatory Visit: Payer: Self-pay | Admitting: Internal Medicine

## 2020-12-20 ENCOUNTER — Ambulatory Visit (INDEPENDENT_AMBULATORY_CARE_PROVIDER_SITE_OTHER): Payer: BC Managed Care – PPO

## 2020-12-20 DIAGNOSIS — I469 Cardiac arrest, cause unspecified: Secondary | ICD-10-CM

## 2020-12-20 LAB — CUP PACEART REMOTE DEVICE CHECK
Battery Remaining Longevity: 66 mo
Battery Voltage: 2.98 V
Brady Statistic RV Percent Paced: 0.01 %
Date Time Interrogation Session: 20220926043623
HighPow Impedance: 70 Ohm
Implantable Lead Implant Date: 20161018
Implantable Lead Location: 753860
Implantable Pulse Generator Implant Date: 20161018
Lead Channel Impedance Value: 285 Ohm
Lead Channel Impedance Value: 361 Ohm
Lead Channel Pacing Threshold Amplitude: 0.875 V
Lead Channel Pacing Threshold Pulse Width: 0.4 ms
Lead Channel Sensing Intrinsic Amplitude: 7.125 mV
Lead Channel Sensing Intrinsic Amplitude: 7.125 mV
Lead Channel Setting Pacing Amplitude: 2.5 V
Lead Channel Setting Pacing Pulse Width: 0.4 ms
Lead Channel Setting Sensing Sensitivity: 0.3 mV

## 2020-12-27 NOTE — Progress Notes (Signed)
Remote ICD transmission.   

## 2021-03-22 ENCOUNTER — Ambulatory Visit (INDEPENDENT_AMBULATORY_CARE_PROVIDER_SITE_OTHER): Payer: BC Managed Care – PPO

## 2021-03-22 DIAGNOSIS — I469 Cardiac arrest, cause unspecified: Secondary | ICD-10-CM

## 2021-03-22 LAB — CUP PACEART REMOTE DEVICE CHECK
Battery Remaining Longevity: 57 mo
Battery Voltage: 2.98 V
Brady Statistic RV Percent Paced: 0.01 %
Date Time Interrogation Session: 20221227054345
HighPow Impedance: 58 Ohm
Implantable Lead Implant Date: 20161018
Implantable Lead Location: 753860
Implantable Pulse Generator Implant Date: 20161018
Lead Channel Impedance Value: 304 Ohm
Lead Channel Impedance Value: 361 Ohm
Lead Channel Pacing Threshold Amplitude: 1.125 V
Lead Channel Pacing Threshold Pulse Width: 0.4 ms
Lead Channel Sensing Intrinsic Amplitude: 5.75 mV
Lead Channel Sensing Intrinsic Amplitude: 5.75 mV
Lead Channel Setting Pacing Amplitude: 2.5 V
Lead Channel Setting Pacing Pulse Width: 0.4 ms
Lead Channel Setting Sensing Sensitivity: 0.3 mV

## 2021-03-31 NOTE — Progress Notes (Signed)
Remote ICD transmission.   

## 2021-06-20 ENCOUNTER — Ambulatory Visit (INDEPENDENT_AMBULATORY_CARE_PROVIDER_SITE_OTHER): Payer: BC Managed Care – PPO

## 2021-06-20 DIAGNOSIS — I469 Cardiac arrest, cause unspecified: Secondary | ICD-10-CM | POA: Diagnosis not present

## 2021-06-21 LAB — CUP PACEART REMOTE DEVICE CHECK
Battery Remaining Longevity: 51 mo
Battery Voltage: 2.98 V
Brady Statistic RV Percent Paced: 0.01 %
Date Time Interrogation Session: 20230327043625
HighPow Impedance: 60 Ohm
Implantable Lead Implant Date: 20161018
Implantable Lead Location: 753860
Implantable Pulse Generator Implant Date: 20161018
Lead Channel Impedance Value: 285 Ohm
Lead Channel Impedance Value: 342 Ohm
Lead Channel Pacing Threshold Amplitude: 1.125 V
Lead Channel Pacing Threshold Pulse Width: 0.4 ms
Lead Channel Sensing Intrinsic Amplitude: 6.5 mV
Lead Channel Sensing Intrinsic Amplitude: 6.5 mV
Lead Channel Setting Pacing Amplitude: 2.5 V
Lead Channel Setting Pacing Pulse Width: 0.4 ms
Lead Channel Setting Sensing Sensitivity: 0.3 mV

## 2021-06-30 NOTE — Progress Notes (Signed)
Remote ICD transmission.   

## 2021-07-01 ENCOUNTER — Ambulatory Visit: Payer: BC Managed Care – PPO | Admitting: Internal Medicine

## 2021-07-01 ENCOUNTER — Encounter: Payer: Self-pay | Admitting: Internal Medicine

## 2021-07-01 VITALS — BP 120/88 | HR 88 | Ht 70.0 in | Wt 208.8 lb

## 2021-07-01 DIAGNOSIS — I428 Other cardiomyopathies: Secondary | ICD-10-CM

## 2021-07-01 DIAGNOSIS — I469 Cardiac arrest, cause unspecified: Secondary | ICD-10-CM | POA: Diagnosis not present

## 2021-07-01 DIAGNOSIS — I10 Essential (primary) hypertension: Secondary | ICD-10-CM

## 2021-07-01 NOTE — Patient Instructions (Addendum)
Medication Instructions:  ?Continue all current medications. ? ?Labwork: ?none ? ?Testing/Procedures: ?Your physician has requested that you have an echocardiogram. Echocardiography is a painless test that uses sound waves to create images of your heart. It provides your doctor with information about the size and shape of your heart and how well your heart?s chambers and valves are working. This procedure takes approximately one hour. There are no restrictions for this procedure. ?Office will contact with results via phone or letter.    ? ?Follow-Up: ?1 year - Dr.  Rayann Heman  ? ?Any Other Special Instructions Will Be Listed Below (If Applicable). ? ? ?If you need a refill on your cardiac medications before your next appointment, please call your pharmacy. ? ?

## 2021-07-01 NOTE — Progress Notes (Signed)
? ?PCP: [facility] ?  ?Primary EP: Dr Rayann Heman ? ?Stephen Hays is a 56 y.o. male who presents today for routine electrophysiology followup.  Since last being seen in our clinic, the patient reports doing very well.  Today, he denies symptoms of palpitations, chest pain, shortness of breath,  lower extremity edema, dizziness, presyncope, syncope, or ICD shocks.  The patient is otherwise without complaint today.  ? ?Past Medical History:  ?Diagnosis Date  ? CAD in native artery 12/2014  ? nonobstructive by cath  ? Complication of anesthesia   ? Hypertension   ? parotid mass   ? Right ventricular dysplasia   ? Tobacco abuse   ? Ventricular fibrillation ([place])   ? RV dysplasia on MR  MDT ICD Oct 2016 (Dr. Rayann Heman)  ? ?Past Surgical History:  ?Procedure Laterality Date  ? BACK SURGERY    ? CARDIAC CATHETERIZATION N/A 01/08/2015  ? Procedure: Left Heart Cath and Coronary Angiography;  Surgeon: Troy Sine, MD;  Location: [place] [facility];  Service: Cardiovascular;  Laterality: N/A;  ? COLONOSCOPY WITH PROPOFOL N/A 05/21/2017  ? Procedure: COLONOSCOPY WITH PROPOFOL;  Surgeon: Daneil Dolin, MD;  [facility];  Service: Endoscopy;  Laterality: N/A;  9:30am  ? EP IMPLANTABLE DEVICE N/A 01/12/2015  ? Medtronic Visa AF MRI compatible ICD implanted by Dr Rayann Heman for secondary prevention of sudden death  ? INCISION AND DRAINAGE    ? on back near buttocks  ? PAROTIDECTOMY Right 01/08/2015  ? Procedure: ABORTED PAROTIDECTOMY;  Surgeon: Ruby Cola, MD;  Location: [place];  Service: ENT;  Laterality: Right;  ? POLYPECTOMY  05/21/2017  ? Procedure: POLYPECTOMY;  Surgeon: Daneil Dolin, MD;  [facility];  Service: Endoscopy;;  colon  ? ? ?ROS- all systems are reviewed and negative except as per HPI above ? ?Current Outpatient Medications  ?Medication Sig Dispense Refill  ? cloNIDine (CATAPRES) 0.2 MG tablet Take 0.2 mg by mouth 2 (two) times daily.     ? spironolactone (ALDACTONE) 25 MG  tablet Take 1 tablet (25 mg total) by mouth daily. 90 tablet 2  ? telmisartan (MICARDIS) 80 MG tablet Take 80 mg by mouth daily.    ? amLODipine (NORVASC) 10 MG tablet TAKE 1 TABLET BY MOUTH EVERY DAY (Patient not taking: Reported on 07/01/2021) 90 tablet 2  ? chlorhexidine (PERIDEX) 0.12 % solution 30 mLs by Mouth Rinse route in the morning and at bedtime. (Patient not taking: Reported on 07/01/2021)    ? hydrochlorothiazide (MICROZIDE) 12.5 MG capsule Take 12.5 mg by mouth daily. (Patient not taking: Reported on 07/01/2021)    ? ?No current facility-administered medications for this visit.  ? ? ?Physical Exam: ?Vitals:  ? 07/01/21 1119  ?BP: 120/88  ?Pulse: 88  ?SpO2: 97%  ?Weight: 208 lb 12.8 oz (94.7 kg)  ?Height: '5\' 10"'$  (1.778 m)  ?  ? ?GEN- The patient is well appearing, alert and oriented x 3 today.   ?Head- normocephalic, atraumatic ?Eyes-  Sclera clear, conjunctiva pink ?Ears- hearing intact ?Oropharynx- clear ?Lungs- Clear to ausculation bilaterally, normal work of breathing ?Chest- ICD pocket is well healed ?Heart- Regular rate and rhythm, no murmurs, rubs or gallops, PMI not laterally displaced ?GI- soft, NT, ND, + BS ?Extremities- no clubbing, cyanosis, or edema ? ?ICD interrogation- reviewed in detail today,  See PACEART report ? ?ekg tracing ordered today is personally reviewed and shows sinus, RBBB ? ?Echo 2017 reviewed ? ?Wt Readings from Last  3 Encounters:  ?07/01/21 208 lb 12.8 oz (94.7 kg)  ?06/25/20 219 lb 6.4 oz (99.5 kg)  ?02/19/19 210 lb (95.3 kg)  ? ? ?Assessment and Plan: ? ?1.  ARVD with prior VF arrest ?Normal ICD function ?See Claudia Des[facility] Art report ?he is not device dependant today ?Update echo to evaluate for disease progression ?He recently had nonsustained VT, without symptoms.  This hovered over his VT monitor zone detection.  I have therefore lowered his VT monitor zone today. ? ?2. HTN ?Stable ?No change required today ?He has labs with his PCP ? ?Return in a year ? ?Thompson Grayer MD,  FACC ?07/01/2021 ?11:34 AM ? ?

## 2021-08-02 ENCOUNTER — Ambulatory Visit (INDEPENDENT_AMBULATORY_CARE_PROVIDER_SITE_OTHER): Payer: BC Managed Care – PPO

## 2021-08-02 DIAGNOSIS — I428 Other cardiomyopathies: Secondary | ICD-10-CM

## 2021-08-02 LAB — ECHOCARDIOGRAM COMPLETE
AR max vel: 2.9 cm2
AV Area VTI: 2.72 cm2
AV Area mean vel: 2.73 cm2
AV Mean grad: 5 mmHg
AV Peak grad: 8.4 mmHg
Ao pk vel: 1.45 m/s
Area-P 1/2: 2.82 cm2
Calc EF: 74 %
S' Lateral: 3.14 cm
Single Plane A2C EF: 74 %
Single Plane A4C EF: 73.7 %

## 2021-09-19 ENCOUNTER — Ambulatory Visit (INDEPENDENT_AMBULATORY_CARE_PROVIDER_SITE_OTHER): Payer: BC Managed Care – PPO

## 2021-09-19 DIAGNOSIS — I469 Cardiac arrest, cause unspecified: Secondary | ICD-10-CM

## 2021-09-20 LAB — CUP PACEART REMOTE DEVICE CHECK
Battery Remaining Longevity: 45 mo
Battery Voltage: 2.98 V
Brady Statistic RV Percent Paced: 0.01 %
Date Time Interrogation Session: 20230626033424
HighPow Impedance: 76 Ohm
Implantable Lead Implant Date: 20161018
Implantable Lead Location: 753860
Implantable Pulse Generator Implant Date: 20161018
Lead Channel Impedance Value: 304 Ohm
Lead Channel Impedance Value: 399 Ohm
Lead Channel Pacing Threshold Amplitude: 1 V
Lead Channel Pacing Threshold Pulse Width: 0.4 ms
Lead Channel Sensing Intrinsic Amplitude: 8.875 mV
Lead Channel Sensing Intrinsic Amplitude: 8.875 mV
Lead Channel Setting Pacing Amplitude: 2.5 V
Lead Channel Setting Pacing Pulse Width: 0.4 ms
Lead Channel Setting Sensing Sensitivity: 0.3 mV

## 2021-10-13 NOTE — Progress Notes (Signed)
Remote ICD transmission.   

## 2021-11-21 ENCOUNTER — Other Ambulatory Visit: Payer: Self-pay | Admitting: Internal Medicine

## 2021-12-09 IMAGING — US US RENAL
1 series · 14 of 25 positions shown · non-contrast
Comparison: None.

CLINICAL DATA: 55-year-old male with hypertension. Chronic kidney
disease.

EXAM:
RENAL / URINARY TRACT ULTRASOUND COMPLETE

[Series 1: us renal · 14 of 47 slices shown]
[im 1/47]
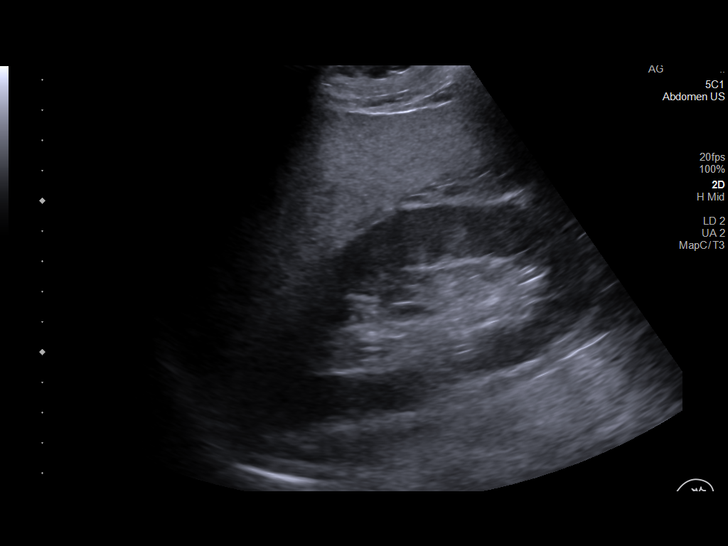
[im 4/47]
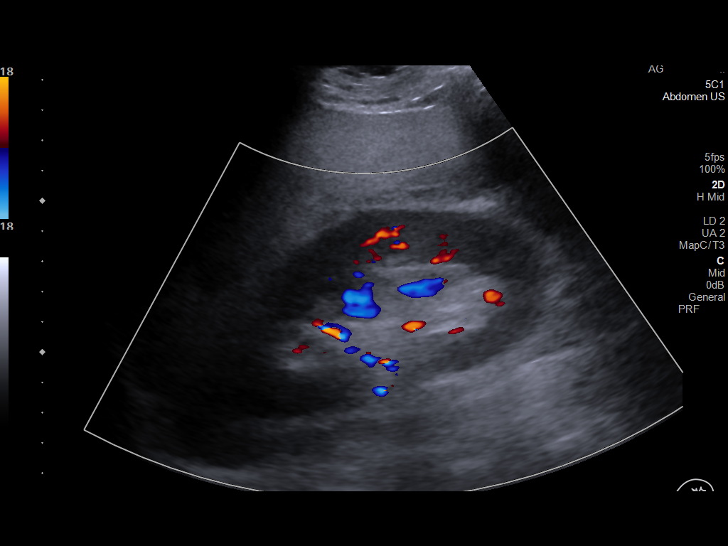
[im 8/47]
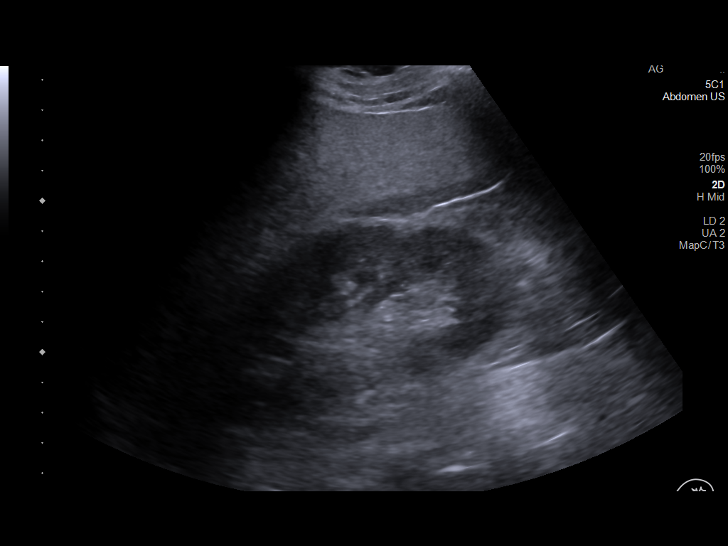
[im 12/47]
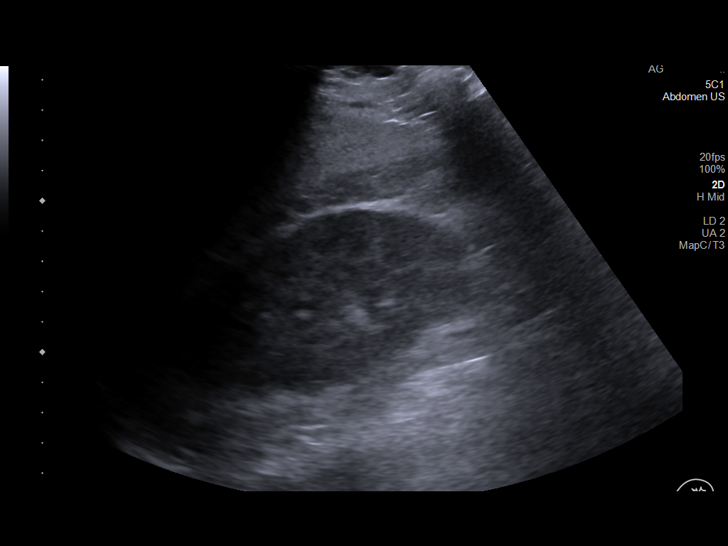
[im 16/47]
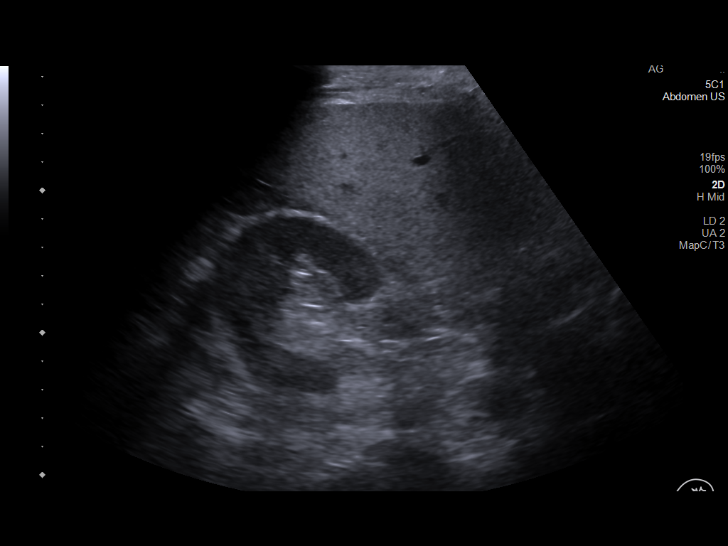
[im 18/47]
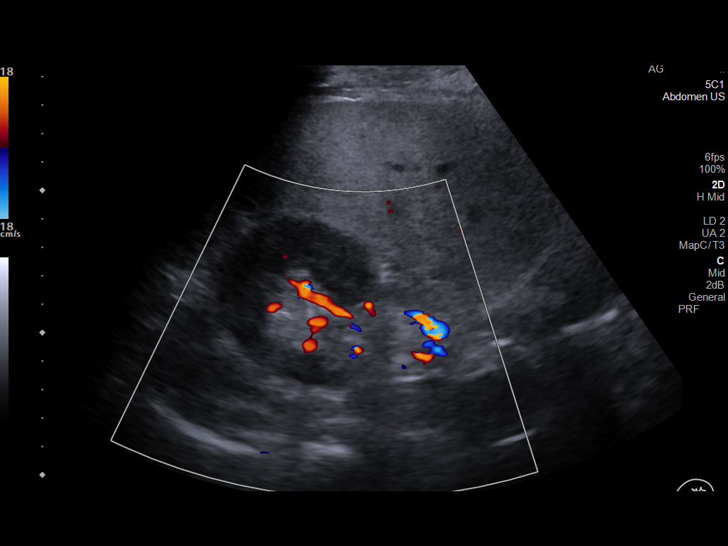
[im 22/47]
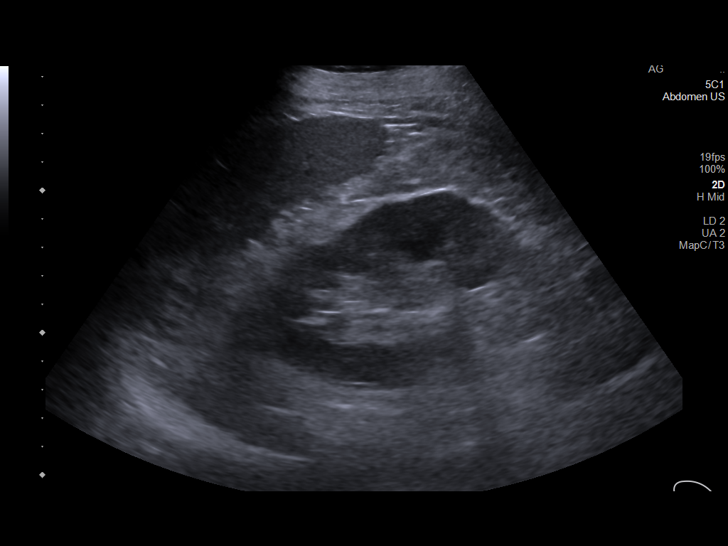
[im 25/47]
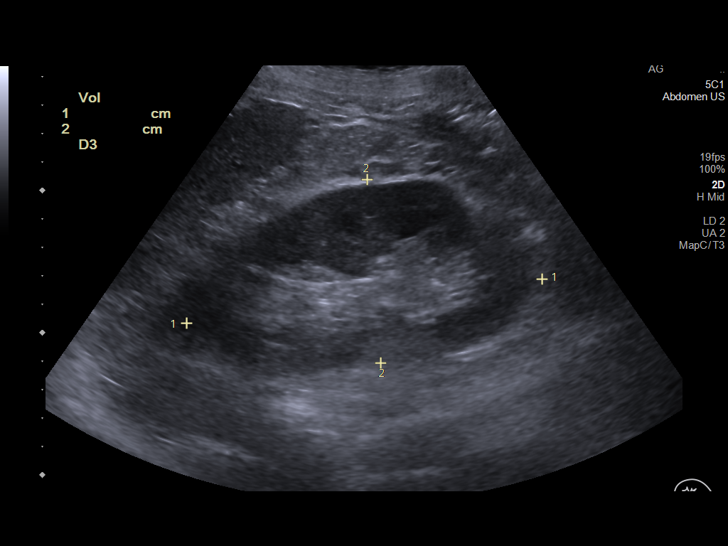
[im 29/47]
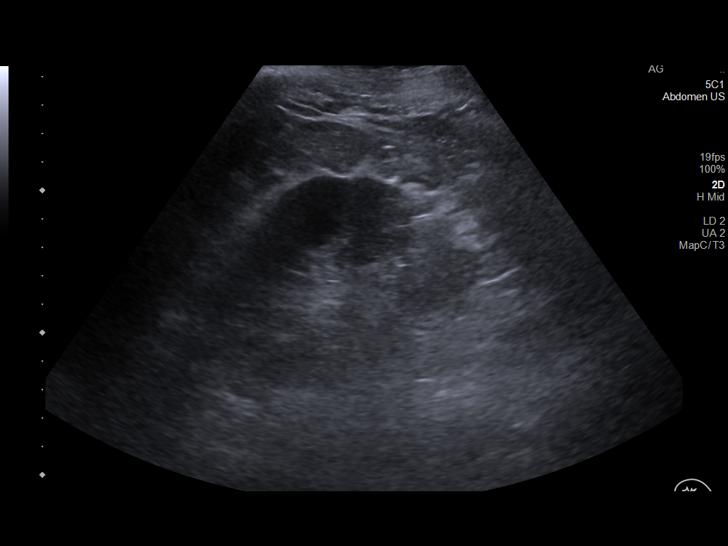
[im 31/47]
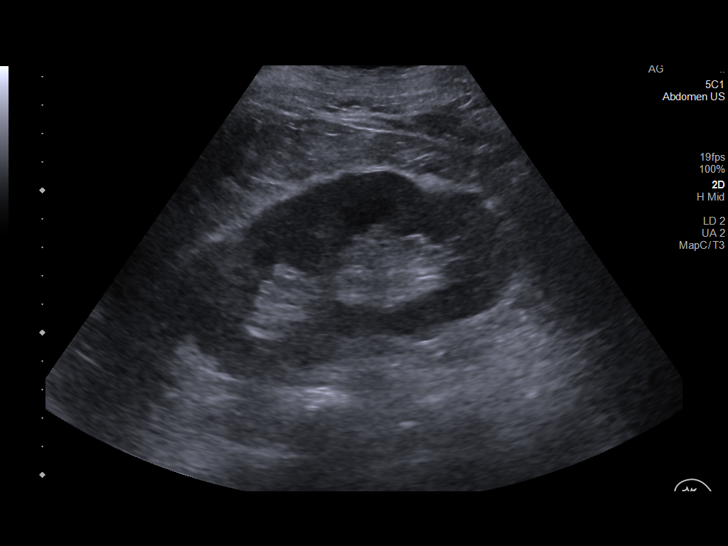
[im 35/47]
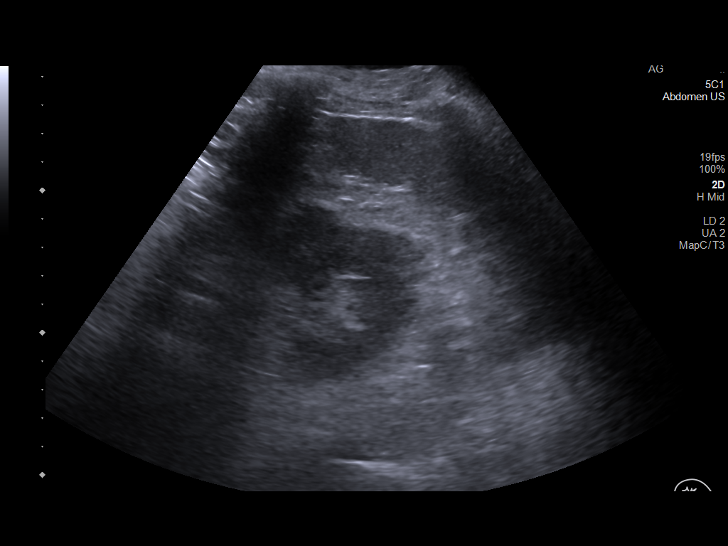
[im 39/47]
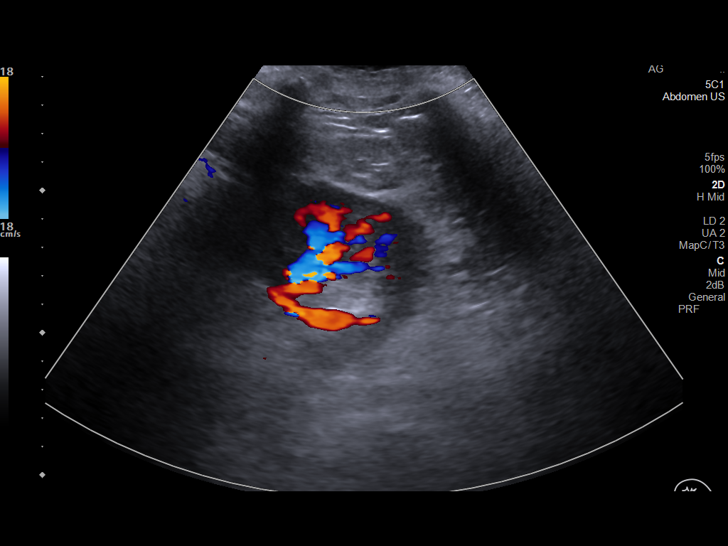
[im 43/47]
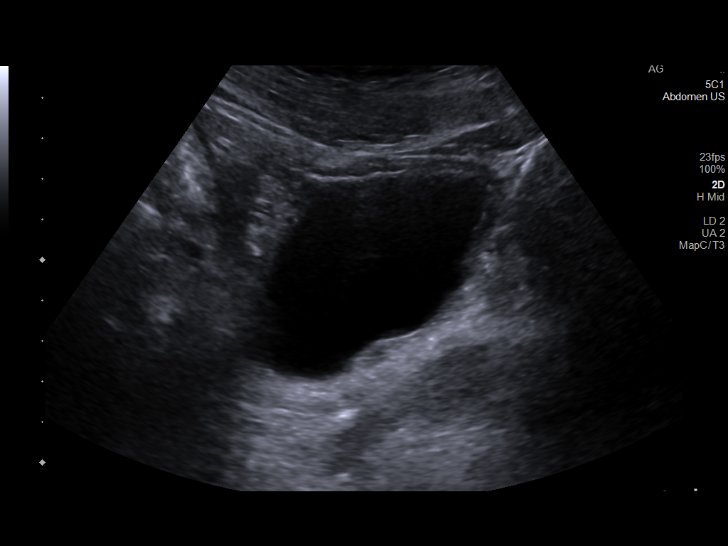
[im 47/47]
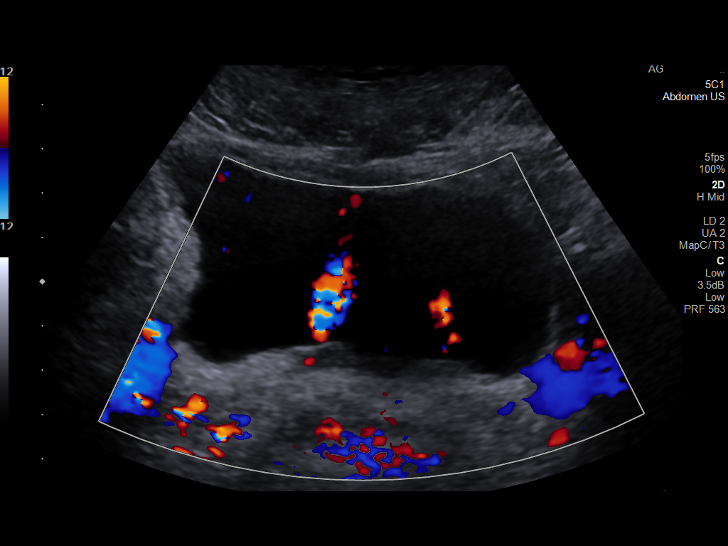

[14 of 25 positions shown; findings below may reference images not displayed]

FINDINGS: Right Kidney:

Renal measurements: 11.9 x 6.2 x 6.2 cm = volume: 236 mL.
Echogenicity within normal limits. No mass or hydronephrosis
visualized.

Left Kidney:

Renal measurements: 12.6 x 6.5 x 6.3 cm = volume: 267 mL.
Echogenicity within normal limits. No mass or hydronephrosis
visualized.

Bladder:

Appears normal for degree of bladder distention. Bilateral ureteral
jets noted.

Other:

There is diffuse increased liver echogenicity most commonly seen in
the setting of fatty infiltration. Superimposed inflammation or
fibrosis is not excluded. Clinical correlation is recommended.
IMPRESSION: 1. Unremarkable kidneys and urinary bladder.
2. Fatty liver.

## 2022-01-30 ENCOUNTER — Ambulatory Visit (INDEPENDENT_AMBULATORY_CARE_PROVIDER_SITE_OTHER): Payer: BC Managed Care – PPO

## 2022-01-30 DIAGNOSIS — I469 Cardiac arrest, cause unspecified: Secondary | ICD-10-CM

## 2022-01-31 LAB — CUP PACEART REMOTE DEVICE CHECK
Battery Remaining Longevity: 48 mo
Battery Voltage: 2.94 V
Brady Statistic RV Percent Paced: 0.01 %
Date Time Interrogation Session: 20231106192808
HighPow Impedance: 75 Ohm
Implantable Lead Connection Status: 753985
Implantable Lead Implant Date: 20161018
Implantable Lead Location: 753860
Implantable Pulse Generator Implant Date: 20161018
Lead Channel Impedance Value: 304 Ohm
Lead Channel Impedance Value: 399 Ohm
Lead Channel Pacing Threshold Amplitude: 1 V
Lead Channel Pacing Threshold Pulse Width: 0.4 ms
Lead Channel Sensing Intrinsic Amplitude: 8.5 mV
Lead Channel Sensing Intrinsic Amplitude: 8.5 mV
Lead Channel Setting Pacing Amplitude: 2.5 V
Lead Channel Setting Pacing Pulse Width: 0.4 ms
Lead Channel Setting Sensing Sensitivity: 0.3 mV
Zone Setting Status: 755011
Zone Setting Status: 755011

## 2022-03-02 NOTE — Progress Notes (Signed)
Remote ICD transmission.   

## 2022-04-05 ENCOUNTER — Encounter: Payer: Self-pay | Admitting: Internal Medicine

## 2022-05-01 ENCOUNTER — Ambulatory Visit: Payer: BC Managed Care – PPO

## 2022-05-01 DIAGNOSIS — I469 Cardiac arrest, cause unspecified: Secondary | ICD-10-CM | POA: Diagnosis not present

## 2022-05-02 LAB — CUP PACEART REMOTE DEVICE CHECK
Battery Remaining Longevity: 48 mo
Battery Voltage: 2.93 V
Brady Statistic RV Percent Paced: 0.01 %
Date Time Interrogation Session: 20240205012203
HighPow Impedance: 77 Ohm
Implantable Lead Connection Status: 753985
Implantable Lead Implant Date: 20161018
Implantable Lead Location: 753860
Implantable Pulse Generator Implant Date: 20161018
Lead Channel Impedance Value: 304 Ohm
Lead Channel Impedance Value: 399 Ohm
Lead Channel Pacing Threshold Amplitude: 1 V
Lead Channel Pacing Threshold Pulse Width: 0.4 ms
Lead Channel Sensing Intrinsic Amplitude: 9.125 mV
Lead Channel Sensing Intrinsic Amplitude: 9.125 mV
Lead Channel Setting Pacing Amplitude: 2.5 V
Lead Channel Setting Pacing Pulse Width: 0.4 ms
Lead Channel Setting Sensing Sensitivity: 0.3 mV
Zone Setting Status: 755011
Zone Setting Status: 755011

## 2022-06-15 NOTE — Progress Notes (Signed)
Remote ICD transmission.   

## 2022-06-30 ENCOUNTER — Encounter: Payer: BC Managed Care – PPO | Admitting: Internal Medicine

## 2022-06-30 ENCOUNTER — Ambulatory Visit: Payer: BC Managed Care – PPO | Admitting: Cardiovascular Disease

## 2022-07-31 ENCOUNTER — Ambulatory Visit (INDEPENDENT_AMBULATORY_CARE_PROVIDER_SITE_OTHER): Payer: BC Managed Care – PPO

## 2022-07-31 DIAGNOSIS — I469 Cardiac arrest, cause unspecified: Secondary | ICD-10-CM

## 2022-07-31 LAB — CUP PACEART REMOTE DEVICE CHECK
Battery Remaining Longevity: 42 mo
Battery Voltage: 2.92 V
Brady Statistic RV Percent Paced: 0.01 %
Date Time Interrogation Session: 20240506022725
HighPow Impedance: 75 Ohm
Implantable Lead Connection Status: 753985
Implantable Lead Implant Date: 20161018
Implantable Lead Location: 753860
Implantable Pulse Generator Implant Date: 20161018
Lead Channel Impedance Value: 285 Ohm
Lead Channel Impedance Value: 399 Ohm
Lead Channel Pacing Threshold Amplitude: 1.25 V
Lead Channel Pacing Threshold Pulse Width: 0.4 ms
Lead Channel Sensing Intrinsic Amplitude: 10.375 mV
Lead Channel Sensing Intrinsic Amplitude: 10.375 mV
Lead Channel Setting Pacing Amplitude: 2.5 V
Lead Channel Setting Pacing Pulse Width: 0.4 ms
Lead Channel Setting Sensing Sensitivity: 0.3 mV
Zone Setting Status: 755011
Zone Setting Status: 755011

## 2022-08-29 NOTE — Progress Notes (Signed)
Remote ICD transmission.   

## 2022-08-30 ENCOUNTER — Other Ambulatory Visit: Payer: Self-pay | Admitting: *Deleted

## 2022-08-30 MED ORDER — SPIRONOLACTONE 25 MG PO TABS: 25.0000 mg | ORAL_TABLET | Freq: Every day | ORAL | 1 refills | Status: AC

## 2022-09-08 ENCOUNTER — Encounter: Payer: Self-pay | Admitting: Cardiovascular Disease

## 2022-09-08 ENCOUNTER — Ambulatory Visit: Payer: BC Managed Care – PPO | Attending: Internal Medicine | Admitting: Cardiovascular Disease

## 2022-09-08 VITALS — BP 146/92 | HR 72 | Ht 70.0 in | Wt 212.6 lb

## 2022-09-08 DIAGNOSIS — I4901 Ventricular fibrillation: Secondary | ICD-10-CM | POA: Diagnosis not present

## 2022-09-08 DIAGNOSIS — I10 Essential (primary) hypertension: Secondary | ICD-10-CM

## 2022-09-08 LAB — CUP PACEART INCLINIC DEVICE CHECK
Battery Remaining Longevity: 40 mo
Battery Voltage: 2.96 V
Brady Statistic RV Percent Paced: 0.01 %
Date Time Interrogation Session: 20240614111929
HighPow Impedance: 79 Ohm
Implantable Lead Connection Status: 753985
Implantable Lead Implant Date: 20161018
Implantable Lead Location: 753860
Implantable Pulse Generator Implant Date: 20161018
Lead Channel Impedance Value: 342 Ohm
Lead Channel Impedance Value: 399 Ohm
Lead Channel Pacing Threshold Amplitude: 1 V
Lead Channel Pacing Threshold Amplitude: 1 V
Lead Channel Pacing Threshold Pulse Width: 0.4 ms
Lead Channel Pacing Threshold Pulse Width: 0.8 ms
Lead Channel Sensing Intrinsic Amplitude: 10.125 mV
Lead Channel Sensing Intrinsic Amplitude: 11.625 mV
Lead Channel Setting Pacing Amplitude: 2.5 V
Lead Channel Setting Pacing Pulse Width: 0.8 ms
Lead Channel Setting Sensing Sensitivity: 0.3 mV
Zone Setting Status: 755011
Zone Setting Status: 755011

## 2022-09-08 MED ORDER — CLONIDINE HCL 0.2 MG PO TABS: 0.2000 mg | ORAL_TABLET | Freq: Two times a day (BID) | ORAL | 3 refills | Status: DC

## 2022-09-08 MED ORDER — AMLODIPINE BESYLATE 10 MG PO TABS: 10.0000 mg | ORAL_TABLET | Freq: Every day | ORAL | 3 refills | Status: AC

## 2022-09-08 NOTE — Addendum Note (Signed)
Addended by: Lacy Duverney R on: 09/08/2022 10:00 AM   Modules accepted: Orders

## 2022-09-08 NOTE — Patient Instructions (Signed)
Medication Instructions:   Amlodipine & Clonidine refilled today Continue all other medications.     Labwork:  none  Testing/Procedures:  none  Follow-Up:  Your physician wants you to follow up in:  1 year.  You should receive a recall letter in the mail about 2 months prior to the time you are due.  If you don't receive this, please call our office to schedule your follow up appointment.      Any Other Special Instructions Will Be Listed Below (If Applicable).   If you need a refill on your cardiac medications before your next appointment, please call your pharmacy.

## 2022-09-08 NOTE — Progress Notes (Signed)
   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  Since last being seen in our clinic, the patient reports doing very well.  Today, he denies symptoms of palpitations, chest pain, shortness of breath,  lower extremity edema, dizziness, presyncope, syncope, or ICD shocks.  The patient is otherwise without complaint today.   He has been out of his amlodipine and clonidine.  ROS- all systems are reviewed and negative except as per HPI above  Current Outpatient Medications  Medication Sig Dispense Refill   cloNIDine (CATAPRES) 0.2 MG tablet Take 0.2 mg by mouth 2 (two) times daily.      furosemide (LASIX) 20 MG tablet SMARTSIG:1 Tablet(s) By Mouth Morning-Evening     sildenafil (VIAGRA) 100 MG tablet Take by mouth.     spironolactone (ALDACTONE) 25 MG tablet Take 1 tablet (25 mg total) by mouth daily. 90 tablet 1   telmisartan (MICARDIS) 80 MG tablet Take 80 mg by mouth daily.     amLODipine (NORVASC) 10 MG tablet TAKE 1 TABLET BY MOUTH EVERY DAY (Patient not taking: Reported on 07/01/2021) 90 tablet 2   chlorhexidine (PERIDEX) 0.12 % solution 30 mLs by Mouth Rinse route in the morning and at bedtime. (Patient not taking: Reported on 07/01/2021)     hydrochlorothiazide (MICROZIDE) 12.5 MG capsule Take 12.5 mg by mouth daily. (Patient not taking: Reported on 09/08/2022)     No current facility-administered medications for this visit.    Physical Exam: Vitals:   09/08/22 0915  BP: (!) 146/92  Pulse: 72  SpO2: 98%  Weight: 212 lb 9.6 oz (96.4 kg)  Height: 5\' 10"  (1.778 m)     Gen: Appears comfortable, well-nourished CV: RRR, no dependent edema The device site is normal -- no tenderness, edema, drainage, redness, threatened erosion. Pulm: breathing easily   ICD interrogation- reviewed in detail today,  See PACEART report  Last ekg tracing 07/01/2021 is personally reviewed and shows  sinus, RBBB  Echo 2017 reviewed  Wt Readings from Last 3 Encounters:  09/08/22 212 lb 9.6 oz (96.4 kg)  07/01/21 208 lb 12.8 oz (94.7 kg)  06/25/20 219 lb 6.4 oz (99.5 kg)    Assessment and Plan:  1.  ARVD with prior VF arrest Normal ICD function See [facility] [employer] report he is not device dependant today TTE 08/02/2021 reviewed - normal RV structure/function Has had slow VT in past  2. HTN BP elevated - will refill amlodipine and clonidine until he can see his PCP in August. He has labs with his PCP  Return in a year  Maurice Small, MD 09/08/2022 9:29 AM

## 2022-10-30 ENCOUNTER — Ambulatory Visit: Payer: BC Managed Care – PPO

## 2022-10-30 DIAGNOSIS — I4901 Ventricular fibrillation: Secondary | ICD-10-CM

## 2022-10-30 DIAGNOSIS — I469 Cardiac arrest, cause unspecified: Secondary | ICD-10-CM

## 2022-10-31 LAB — CUP PACEART REMOTE DEVICE CHECK
Battery Remaining Longevity: 41 mo
Battery Voltage: 2.91 V
Brady Statistic RV Percent Paced: 0.01 %
Date Time Interrogation Session: 20240806042307
HighPow Impedance: 73 Ohm
Implantable Lead Connection Status: 753985
Implantable Lead Implant Date: 20161018
Implantable Lead Location: 753860
Implantable Pulse Generator Implant Date: 20161018
Lead Channel Impedance Value: 304 Ohm
Lead Channel Impedance Value: 361 Ohm
Lead Channel Pacing Threshold Amplitude: 1.125 V
Lead Channel Pacing Threshold Pulse Width: 0.4 ms
Lead Channel Sensing Intrinsic Amplitude: 7.875 mV
Lead Channel Sensing Intrinsic Amplitude: 7.875 mV
Lead Channel Setting Pacing Amplitude: 2.5 V
Lead Channel Setting Pacing Pulse Width: 0.4 ms
Lead Channel Setting Sensing Sensitivity: 0.3 mV
Zone Setting Status: 755011
Zone Setting Status: 755011

## 2022-11-10 NOTE — Progress Notes (Signed)
Remote ICD transmission.   

## 2023-01-29 ENCOUNTER — Ambulatory Visit (INDEPENDENT_AMBULATORY_CARE_PROVIDER_SITE_OTHER): Payer: BC Managed Care – PPO

## 2023-01-29 DIAGNOSIS — I4901 Ventricular fibrillation: Secondary | ICD-10-CM | POA: Diagnosis not present

## 2023-01-30 LAB — CUP PACEART REMOTE DEVICE CHECK
Battery Remaining Longevity: 37 mo
Battery Voltage: 2.9 V
Brady Statistic RV Percent Paced: 0.01 %
Date Time Interrogation Session: 20241105042205
HighPow Impedance: 67 Ohm
Implantable Lead Connection Status: 753985
Implantable Lead Implant Date: 20161018
Implantable Lead Location: 753860
Implantable Pulse Generator Implant Date: 20161018
Lead Channel Impedance Value: 285 Ohm
Lead Channel Impedance Value: 361 Ohm
Lead Channel Pacing Threshold Amplitude: 1.125 V
Lead Channel Pacing Threshold Pulse Width: 0.4 ms
Lead Channel Sensing Intrinsic Amplitude: 7.375 mV
Lead Channel Sensing Intrinsic Amplitude: 7.375 mV
Lead Channel Setting Pacing Amplitude: 2.5 V
Lead Channel Setting Pacing Pulse Width: 0.4 ms
Lead Channel Setting Sensing Sensitivity: 0.3 mV
Zone Setting Status: 755011
Zone Setting Status: 755011

## 2023-02-20 NOTE — Progress Notes (Signed)
Remote ICD transmission.   

## 2023-04-30 ENCOUNTER — Ambulatory Visit (INDEPENDENT_AMBULATORY_CARE_PROVIDER_SITE_OTHER): Payer: BC Managed Care – PPO

## 2023-04-30 DIAGNOSIS — I469 Cardiac arrest, cause unspecified: Secondary | ICD-10-CM

## 2023-04-30 LAB — CUP PACEART REMOTE DEVICE CHECK
Battery Remaining Longevity: 33 mo
Battery Voltage: 2.89 V
Brady Statistic RV Percent Paced: 0.01 %
Date Time Interrogation Session: 20250203052826
HighPow Impedance: 62 Ohm
Implantable Lead Connection Status: 753985
Implantable Lead Implant Date: 20161018
Implantable Lead Location: 753860
Implantable Pulse Generator Implant Date: 20161018
Lead Channel Impedance Value: 285 Ohm
Lead Channel Impedance Value: 361 Ohm
Lead Channel Pacing Threshold Amplitude: 1.125 V
Lead Channel Pacing Threshold Pulse Width: 0.4 ms
Lead Channel Sensing Intrinsic Amplitude: 7.625 mV
Lead Channel Sensing Intrinsic Amplitude: 7.625 mV
Lead Channel Setting Pacing Amplitude: 2.5 V
Lead Channel Setting Pacing Pulse Width: 0.4 ms
Lead Channel Setting Sensing Sensitivity: 0.3 mV
Zone Setting Status: 755011
Zone Setting Status: 755011

## 2023-06-06 NOTE — Progress Notes (Signed)
 Remote ICD transmission.

## 2023-07-30 ENCOUNTER — Ambulatory Visit (INDEPENDENT_AMBULATORY_CARE_PROVIDER_SITE_OTHER): Payer: BC Managed Care – PPO

## 2023-07-30 DIAGNOSIS — I469 Cardiac arrest, cause unspecified: Secondary | ICD-10-CM | POA: Diagnosis not present

## 2023-07-31 LAB — CUP PACEART REMOTE DEVICE CHECK
Battery Remaining Longevity: 30 mo
Battery Voltage: 2.95 V
Brady Statistic RV Percent Paced: 0.01 %
Date Time Interrogation Session: 20250505022825
HighPow Impedance: 69 Ohm
Implantable Lead Connection Status: 753985
Implantable Lead Implant Date: 20161018
Implantable Lead Location: 753860
Implantable Pulse Generator Implant Date: 20161018
Lead Channel Impedance Value: 304 Ohm
Lead Channel Impedance Value: 361 Ohm
Lead Channel Pacing Threshold Amplitude: 1 V
Lead Channel Pacing Threshold Pulse Width: 0.4 ms
Lead Channel Sensing Intrinsic Amplitude: 7.875 mV
Lead Channel Sensing Intrinsic Amplitude: 7.875 mV
Lead Channel Setting Pacing Amplitude: 2.5 V
Lead Channel Setting Pacing Pulse Width: 0.4 ms
Lead Channel Setting Sensing Sensitivity: 0.3 mV
Zone Setting Status: 755011
Zone Setting Status: 755011

## 2023-08-10 ENCOUNTER — Ambulatory Visit: Payer: Self-pay | Admitting: Cardiovascular Disease

## 2023-08-31 ENCOUNTER — Encounter: Payer: BC Managed Care – PPO | Admitting: Cardiovascular Disease

## 2023-09-17 NOTE — Addendum Note (Signed)
 Addended by: TAWNI DRILLING D on: 09/17/2023 11:26 AM   Modules accepted: Orders

## 2023-09-17 NOTE — Progress Notes (Signed)
 Remote ICD transmission.

## 2023-10-14 ENCOUNTER — Other Ambulatory Visit: Payer: Self-pay | Admitting: Cardiovascular Disease

## 2023-10-26 ENCOUNTER — Encounter: Payer: Self-pay | Admitting: *Deleted

## 2023-10-26 ENCOUNTER — Ambulatory Visit: Attending: Cardiovascular Disease | Admitting: Cardiovascular Disease

## 2023-10-26 ENCOUNTER — Encounter: Payer: Self-pay | Admitting: Cardiovascular Disease

## 2023-10-26 VITALS — BP 146/93 | HR 72 | Wt 210.0 lb

## 2023-10-26 DIAGNOSIS — I10 Essential (primary) hypertension: Secondary | ICD-10-CM | POA: Diagnosis not present

## 2023-10-26 LAB — CUP PACEART INCLINIC DEVICE CHECK
Date Time Interrogation Session: 20250801172539
Implantable Lead Connection Status: 753985
Implantable Lead Implant Date: 20161018
Implantable Lead Location: 753860
Implantable Pulse Generator Implant Date: 20161018

## 2023-10-26 MED ORDER — METOPROLOL TARTRATE 25 MG PO TABS: 25.0000 mg | ORAL_TABLET | Freq: Two times a day (BID) | ORAL | 6 refills | Status: AC

## 2023-10-26 NOTE — Patient Instructions (Signed)
 Medication Instructions:   Begin Metoprolol  tart (Lopressor ) 25mg  twice a day   Continue all other medications.     Labwork:  none  Testing/Procedures:  none  Follow-Up:  Your physician wants you to follow up in:  1 year.  You should receive a recall letter in the mail about 2 months prior to the time you are due.  If you don't receive this, please call our office to schedule your follow up appointment.      Any Other Special Instructions Will Be Listed Below (If Applicable).   If you need a refill on your cardiac medications before your next appointment, please call your pharmacy.

## 2023-10-26 NOTE — Progress Notes (Signed)
   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 male who presents today for routine electrophysiology followup.  Since last being seen in our clinic, the patient reports doing very well.  Today, he denies symptoms of palpitations, chest pain, shortness of breath,  lower extremity edema, dizziness, presyncope, syncope, or ICD shocks.  The patient is otherwise without complaint today.   He has been out of his amlodipine  and clonidine .   Physical Exam: Vitals:   10/26/23 1541  BP: (!) 146/93  Pulse: 72  SpO2: 100%  Weight: 210 lb (95.3 kg)     Gen: Appears comfortable, well-nourished CV: RRR, no dependent edema The device site is normal -- no tenderness, edema, drainage, redness, threatened erosion. Pulm: breathing easily   ICD interrogation- reviewed in detail today,  See PACEART report  EKG Interpretation Date/Time:  Friday October 26 2023 15:45:47 EDT Ventricular Rate:  72 PR Interval:  180 QRS Duration:  160 QT Interval:  432 QTC Calculation: 473 R Axis:   62  Text Interpretation: Normal sinus rhythm Right bundle branch block When compared with ECG of 03-Feb-2017 13:30, No significant change was found Confirmed by Nancey Scotts [phone]) on 10/26/2023 4:01:41 PM    Echo 2017 reviewed  Wt Readings from Last 3 Encounters:  10/26/23 210 lb (95.3 kg)  09/08/22 212 lb 9.6 oz (96.4 kg)  07/01/21 208 lb 12.8 oz (94.7 kg)    Assessment and Plan:  1.  ARVC with prior VF arrest Normal ICD function See Elisabeth Art report he is not device dependant today TTE 08/02/2021 reviewed - normal RV structure/function Has had NSVT, slow VT in past Will start metoprolol  25mg  BID I counseled him to avoid vigorous exercise.  2. HTN BP elevated - will start metoprolol  He has labs with his PCP  Return in a year  Scotts FORBES Nancey, MD 10/26/2023 4:01 PM

## 2023-10-29 ENCOUNTER — Ambulatory Visit: Payer: BC Managed Care – PPO

## 2023-10-29 DIAGNOSIS — I469 Cardiac arrest, cause unspecified: Secondary | ICD-10-CM

## 2023-10-29 LAB — CUP PACEART REMOTE DEVICE CHECK
Battery Remaining Longevity: 29 mo
Battery Voltage: 2.95 V
Brady Statistic RV Percent Paced: 0.02 %
Date Time Interrogation Session: 20250804012404
HighPow Impedance: 73 Ohm
Implantable Lead Connection Status: 753985
Implantable Lead Implant Date: 20161018
Implantable Lead Location: 753860
Implantable Pulse Generator Implant Date: 20161018
Lead Channel Impedance Value: 285 Ohm
Lead Channel Impedance Value: 361 Ohm
Lead Channel Pacing Threshold Amplitude: 1.125 V
Lead Channel Pacing Threshold Pulse Width: 0.4 ms
Lead Channel Sensing Intrinsic Amplitude: 6.375 mV
Lead Channel Sensing Intrinsic Amplitude: 6.375 mV
Lead Channel Setting Pacing Amplitude: 2.5 V
Lead Channel Setting Pacing Pulse Width: 0.4 ms
Lead Channel Setting Sensing Sensitivity: 0.3 mV
Zone Setting Status: 755011
Zone Setting Status: 755011

## 2023-11-06 ENCOUNTER — Ambulatory Visit: Payer: Self-pay | Admitting: Cardiovascular Disease

## 2023-12-24 NOTE — Progress Notes (Signed)
 Remote ICD Transmission

## 2024-01-17 ENCOUNTER — Other Ambulatory Visit: Payer: Self-pay | Admitting: Cardiovascular Disease

## 2024-01-28 ENCOUNTER — Ambulatory Visit (INDEPENDENT_AMBULATORY_CARE_PROVIDER_SITE_OTHER): Payer: BC Managed Care – PPO

## 2024-01-28 DIAGNOSIS — I469 Cardiac arrest, cause unspecified: Secondary | ICD-10-CM | POA: Diagnosis not present

## 2024-01-29 LAB — CUP PACEART REMOTE DEVICE CHECK
Battery Remaining Longevity: 26 mo
Battery Voltage: 2.94 V
Brady Statistic RV Percent Paced: 0.16 %
Date Time Interrogation Session: 20251103001705
HighPow Impedance: 68 Ohm
Implantable Lead Connection Status: 753985
Implantable Lead Implant Date: 20161018
Implantable Lead Location: 753860
Implantable Pulse Generator Implant Date: 20161018
Lead Channel Impedance Value: 304 Ohm
Lead Channel Impedance Value: 399 Ohm
Lead Channel Pacing Threshold Amplitude: 1.25 V
Lead Channel Pacing Threshold Pulse Width: 0.4 ms
Lead Channel Sensing Intrinsic Amplitude: 5.625 mV
Lead Channel Sensing Intrinsic Amplitude: 5.625 mV
Lead Channel Setting Pacing Amplitude: 2.5 V
Lead Channel Setting Pacing Pulse Width: 0.4 ms
Lead Channel Setting Sensing Sensitivity: 0.3 mV
Zone Setting Status: 755011
Zone Setting Status: 755011

## 2024-01-31 ENCOUNTER — Ambulatory Visit: Payer: Self-pay | Admitting: Cardiovascular Disease

## 2024-01-31 NOTE — Progress Notes (Signed)
 Remote ICD Transmission

## 2024-04-28 ENCOUNTER — Ambulatory Visit: Payer: BC Managed Care – PPO

## 2024-04-29 LAB — CUP PACEART REMOTE DEVICE CHECK
Battery Remaining Longevity: 25 mo
Battery Voltage: 2.93 V
Brady Statistic RV Percent Paced: 0.04 %
Date Time Interrogation Session: 20260203010607
HighPow Impedance: 71 Ohm
Implantable Lead Connection Status: 753985
Implantable Lead Implant Date: 20161018
Implantable Lead Location: 753860
Implantable Pulse Generator Implant Date: 20161018
Lead Channel Impedance Value: 304 Ohm
Lead Channel Impedance Value: 399 Ohm
Lead Channel Pacing Threshold Amplitude: 1 V
Lead Channel Pacing Threshold Pulse Width: 0.4 ms
Lead Channel Sensing Intrinsic Amplitude: 5.125 mV
Lead Channel Sensing Intrinsic Amplitude: 5.125 mV
Lead Channel Setting Pacing Amplitude: 2.5 V
Lead Channel Setting Pacing Pulse Width: 0.4 ms
Lead Channel Setting Sensing Sensitivity: 0.3 mV
Zone Setting Status: 755011
Zone Setting Status: 755011

## 2024-07-28 ENCOUNTER — Ambulatory Visit: Payer: BC Managed Care – PPO

## 2024-10-27 ENCOUNTER — Ambulatory Visit

## 2025-01-26 ENCOUNTER — Ambulatory Visit

## 2025-04-27 ENCOUNTER — Ambulatory Visit

## 2025-07-27 ENCOUNTER — Ambulatory Visit

## 2025-10-26 ENCOUNTER — Ambulatory Visit

## 2026-01-25 ENCOUNTER — Ambulatory Visit

## 2026-04-26 ENCOUNTER — Ambulatory Visit

## 2026-07-26 ENCOUNTER — Ambulatory Visit
